# Patient Record
Sex: Female | Born: 1955 | Race: White | Hispanic: No | Marital: Married | Smoking: Never smoker
Health system: Southern US, Community
[De-identification: ages and names within clinical notes are randomized; demographics above are authoritative.]

## PROBLEM LIST (undated history)

## (undated) DIAGNOSIS — Z87442 Personal history of urinary calculi: Secondary | ICD-10-CM

## (undated) HISTORY — PX: ABDOMINAL HYSTERECTOMY: SHX81

## (undated) HISTORY — PX: LAPAROSCOPIC BILATERAL SALPINGO OOPHERECTOMY: SHX5890

---

## 2005-01-13 ENCOUNTER — Ambulatory Visit (HOSPITAL_COMMUNITY): Admission: RE | Admit: 2005-01-13 | Discharge: 2005-01-13 | Payer: Self-pay | Admitting: Family Medicine

## 2007-04-05 ENCOUNTER — Ambulatory Visit (HOSPITAL_COMMUNITY): Admission: RE | Admit: 2007-04-05 | Discharge: 2007-04-05 | Payer: Self-pay | Admitting: Family Medicine

## 2007-11-03 ENCOUNTER — Ambulatory Visit (HOSPITAL_COMMUNITY): Admission: RE | Admit: 2007-11-03 | Discharge: 2007-11-03 | Payer: Self-pay | Admitting: Obstetrics & Gynecology

## 2007-11-03 ENCOUNTER — Encounter: Payer: Self-pay | Admitting: Obstetrics & Gynecology

## 2008-12-01 ENCOUNTER — Ambulatory Visit (HOSPITAL_COMMUNITY): Admission: RE | Admit: 2008-12-01 | Discharge: 2008-12-01 | Payer: Self-pay | Admitting: Family Medicine

## 2010-11-12 NOTE — Op Note (Signed)
Tracey Sparks, Tracey Sparks                 ACCOUNT NO.:  [id number]   MEDICAL RECORD NO.:  [id number]          PATIENT TYPE:  AMB   LOCATION:  DAY                           FACILITY:  [facility]   PHYSICIAN:  Lazaro Arms, M.D.   DATE OF BIRTH:  [birth year]   DATE OF PROCEDURE:  11/03/2007  DATE OF DISCHARGE:                               OPERATIVE REPORT   PREOPERATIVE DIAGNOSES:  1. Left lower quadrant pain.  2. 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   POSTOPERATIVE DIAGNOSES:  1. Left lower quadrant pain.  2. 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   PROCEDURE:  Laparoscopic bilateral salpingo-oophorectomy with lysis of  adhesions.   SURGEON:  Lazaro Arms, MD.   ANESTHESIA:  General endotracheal.   FINDINGS:  The patient had adhesions of the small bowel and large bowel  to the pelvic side wall and on to the bladder and vaginal cuff, which  were stuck down sort of bilaterally in tangle of adhesions into the  pelvic side wall.  They were liberated from the adhesions and then taken  down without difficulty. They, otherwise, appeared to be normal.   DESCRIPTION OF OPERATION:  The patient was taken to the operating room,  placed in the supine position, where she underwent general endotracheal  anesthesia.  She was placed in lithotomy position, prepped and draped in  the usual sterile fashion.  Incision was made in the umbilicus.  A  Veress needle was used and placed to peritoneal cavity with one pass  without difficulty.  It was confirmed and it was insufflated with CO2  gas.  A nonbladed direct visualization trocar was used and placed to the  peritoneal cavity with one pass without difficulty.  The incisions were  made in the suprapubic midline and also in left lower quadrant and 5-mm  trocars were placed without difficulty.  Adhesions were taken down with  a harmonic scalpel and also bluntly.  Care was made not to injure the  bowel.  Then, identified the ureters bilaterally.   Then, identified the  ovaries, liberated them from the tangle of adhesions, took down the  infundibulopelvic ligament bilaterally, took down the round ligaments  bilaterally and liberated from the pelvic side wall taking it down from  the peritoneum as well using a harmonic scalpel for both sides.  There  was good hemostasis.  Both ovaries removed with Endopouch.  Pelvis  irrigated vigorously, all pedicles found to be hemostatic.  Fluid was  left in the peritoneal cavity to try to prevent postoperative adhesions  from  occurring again.  The umbilical fascia was closed with single 0-Vicryl  suture and all skin was closed using skin staples.  The patient  tolerated the procedure well.  She experienced minimal blood loss.  Taken to recovery room in good stable condition.  All counts were  correct x3.      Lazaro Arms, M.D.  Electronically Signed     LHE/MEDQ  D:  11/03/2007  T:  11/03/2007  Job:  [id number]

## 2013-08-03 ENCOUNTER — Other Ambulatory Visit (HOSPITAL_COMMUNITY): Payer: Self-pay | Admitting: Family Medicine

## 2013-08-03 DIAGNOSIS — Z139 Encounter for screening, unspecified: Secondary | ICD-10-CM

## 2013-08-05 ENCOUNTER — Ambulatory Visit (HOSPITAL_COMMUNITY)
Admission: RE | Admit: 2013-08-05 | Discharge: 2013-08-05 | Disposition: A | Discharge status: Home / Self Care | Payer: BC Managed Care – PPO | Source: Ambulatory Visit | Attending: Family Medicine | Admitting: Family Medicine

## 2013-08-05 DIAGNOSIS — Z139 Encounter for screening, unspecified: Secondary | ICD-10-CM

## 2013-08-05 DIAGNOSIS — Z1231 Encounter for screening mammogram for malignant neoplasm of breast: Secondary | ICD-10-CM | POA: Insufficient documentation

## 2014-08-30 ENCOUNTER — Other Ambulatory Visit (HOSPITAL_COMMUNITY): Payer: Self-pay | Admitting: Family Medicine

## 2014-08-30 DIAGNOSIS — Z1231 Encounter for screening mammogram for malignant neoplasm of breast: Secondary | ICD-10-CM

## 2014-09-05 ENCOUNTER — Ambulatory Visit (HOSPITAL_COMMUNITY): Payer: BC Managed Care – PPO

## 2015-04-09 ENCOUNTER — Other Ambulatory Visit (HOSPITAL_COMMUNITY): Payer: Self-pay | Admitting: Family Medicine

## 2015-04-09 DIAGNOSIS — Z1231 Encounter for screening mammogram for malignant neoplasm of breast: Secondary | ICD-10-CM

## 2015-04-18 ENCOUNTER — Ambulatory Visit (HOSPITAL_COMMUNITY)
Admission: RE | Admit: 2015-04-18 | Discharge: 2015-04-18 | Disposition: A | Discharge status: Home / Self Care | Payer: BC Managed Care – PPO | Source: Ambulatory Visit | Attending: Family Medicine | Admitting: Family Medicine

## 2015-04-18 DIAGNOSIS — Z1231 Encounter for screening mammogram for malignant neoplasm of breast: Secondary | ICD-10-CM | POA: Insufficient documentation

## 2016-06-16 ENCOUNTER — Encounter (HOSPITAL_COMMUNITY): Payer: Self-pay | Admitting: *Deleted

## 2016-06-16 ENCOUNTER — Emergency Department (HOSPITAL_COMMUNITY)
Admission: EM | Admit: 2016-06-16 | Discharge: 2016-06-17 | Disposition: A | Discharge status: Home / Self Care | Payer: BC Managed Care – PPO | Source: Home / Self Care | Attending: Emergency Medicine | Admitting: Emergency Medicine

## 2016-06-16 DIAGNOSIS — F419 Anxiety disorder, unspecified: Secondary | ICD-10-CM | POA: Diagnosis not present

## 2016-06-16 DIAGNOSIS — Z87442 Personal history of urinary calculi: Secondary | ICD-10-CM | POA: Insufficient documentation

## 2016-06-16 DIAGNOSIS — N135 Crossing vessel and stricture of ureter without hydronephrosis: Secondary | ICD-10-CM

## 2016-06-16 DIAGNOSIS — N131 Hydronephrosis with ureteral stricture, not elsewhere classified: Secondary | ICD-10-CM | POA: Diagnosis present

## 2016-06-16 DIAGNOSIS — Z90710 Acquired absence of both cervix and uterus: Secondary | ICD-10-CM | POA: Diagnosis not present

## 2016-06-16 LAB — URINALYSIS, ROUTINE W REFLEX MICROSCOPIC
Bilirubin Urine: NEGATIVE
GLUCOSE, UA: NEGATIVE mg/dL
HGB URINE DIPSTICK: NEGATIVE
KETONES UR: 5 mg/dL — AB
LEUKOCYTES UA: NEGATIVE
NITRITE: NEGATIVE
PROTEIN: NEGATIVE mg/dL
Specific Gravity, Urine: 1.013 (ref 1.005–1.030)
pH: 7 (ref 5.0–8.0)

## 2016-06-16 NOTE — ED Provider Notes (Signed)
[facility]-EMERGENCY DEPT Provider Note   CSN: [id number] Arrival date & time: 06/16/16  2301 By signing my name below, I, Tracey Sparks, attest that this documentation has been prepared under the direction and in the presence of Tracey AlbeIva Becca Bayne, MD. Electronically Signed: Bridgette HabermannMaria Sparks, ED Scribe. 06/17/16. 12:13 AM.  Time seen 12:06 AM  History   Chief Complaint Chief Complaint  Patient presents with  . Flank Pain   HPI Comments: Tracey Sparks is a 60 y.o. female with no pertinent PMHx, who presents to the Emergency Department complaining of 10/10, shooting, right-sided flank pain and right-sided abdominal pain onset on December 18  with associated nausea and vomiting (x6-8 episodes). Pt states her pain initially was intermittent, then around 6:30 pm when her vomiting began, her pain became more constant. Pain is exacerbated with movement and lying flat. Nothing makes it feel better except having her husband rub the area. Pt notes she's been to physical therapy recently for a broken left humerus but denies any recent injury or trauma to the area. Pt is not a smoker and does not drink alcohol. Pt denies dysuria, hematuria, urinary frequency, fever, diarrhea, or any other associated symptoms. Patient states she has difficulty sitting still. She states her father had kidney stones.   PCP: Tracey Sparks, P  The history is provided by the patient. No language interpreter was used.    History reviewed. No pertinent past medical history.  There are no active problems to display for this patient.   Past Surgical History:  Procedure Laterality Date  . ABDOMINAL HYSTERECTOMY    . CESAREAN SECTION      OB History    No data available       Home Medications    Prior to Admission medications   Medication Sig Start Date End Date Taking? Authorizing Provider  ondansetron (ZOFRAN) 4 MG tablet Take 1 tablet (4 mg total) by mouth every 8 (eight) hours as needed for nausea or vomiting. 06/17/16   Tracey AlbeIva Ziv Welchel, MD    PERCOCET 5-325 MG tablet Take 1-2 tablets by mouth every 6 (six) hours as needed for severe pain. 06/17/16   Tracey AlbeIva Keneshia Tena, MD  PERCOCET 5-325 MG tablet Take 1-2 tablets by mouth every 6 (six) hours as needed for severe pain. 06/17/16   Tracey AlbeIva Rosamary Boudreau, MD  tamsulosin (FLOMAX) 0.4 MG CAPS capsule Take 1 po QD until you pass the stone. 06/17/16   Tracey AlbeIva Noora Locascio, MD    Family History History reviewed. No pertinent family history.  Social History Social History  Substance Use Topics  . Smoking status: Never Smoker  . Smokeless tobacco: Never Used  . Alcohol use No  employed Lives with spouse   Allergies   Patient has no known allergies.   Review of Systems Review of Systems  Constitutional: Negative for chills and fever.  Gastrointestinal: Positive for abdominal pain, nausea and vomiting. Negative for diarrhea.  Genitourinary: Positive for flank pain. Negative for dysuria, frequency and hematuria.  All other systems reviewed and are negative.    Physical Exam Updated Vital Signs BP 153/86 (BP Location: Right Arm)   Pulse 83   Temp 97.6 F (36.4 C) (Oral)   Resp 20   Ht 5\' 2"  (1.575 m)   Wt 150 lb (68 kg)   SpO2 100%   BMI 27.44 kg/m   Vital signs normal    Physical Exam  Constitutional: She is oriented to person, place, and time. She appears well-developed and well-nourished.  Non-toxic appearance. She does not  appear ill. She appears distressed.  Pt seems to have difficulty sitting still.  HENT:  Head: Normocephalic and atraumatic.  Right Ear: External ear normal.  Left Ear: External ear normal.  Nose: Nose normal. No mucosal edema or rhinorrhea.  Mouth/Throat: Oropharynx is clear and moist. No dental abscesses or uvula swelling.  Eyes: Conjunctivae and EOM are normal. Pupils are equal, round, and reactive to light.  Neck: Normal range of motion and full passive range of motion without pain. Neck supple.  Cardiovascular: Normal rate, regular rhythm and normal heart sounds.   Exam reveals no gallop and no friction rub.   No murmur heard. Pulmonary/Chest: Effort normal and breath sounds normal. No respiratory distress. She has no wheezes. She has no rales. She exhibits no tenderness.  Abdominal: Soft. Bowel sounds are normal. She exhibits no distension and no mass. There is tenderness. There is no guarding.  Diffuse right-sided tenderness.  Genitourinary:  Genitourinary Comments: Tenderness to palpation to right flank.  Musculoskeletal: Normal range of motion. She exhibits no edema or tenderness.  Moves all extremities well.  Neurological: She is alert and oriented to person, place, and time. No cranial nerve deficit.  Skin: Skin is warm and dry. No rash noted. No erythema. No pallor.  Psychiatric: She has a normal mood and affect. Her behavior is normal. Her mood appears not anxious.  Nursing note and vitals reviewed.    ED Treatments / Results  DIAGNOSTIC STUDIES: Oxygen Saturation is 100% on RA, normal by my interpretation.      Labs (all labs ordered are listed, but only abnormal results are displayed) Results for orders placed or performed during the hospital encounter of 06/16/16  Urinalysis, Routine w reflex microscopic  Result Value Ref Range   Color, Urine STRAW (A) YELLOW   APPearance CLEAR CLEAR   Specific Gravity, Urine 1.013 1.005 - 1.030   pH 7.0 5.0 - 8.0   Glucose, UA NEGATIVE NEGATIVE mg/dL   Hgb urine dipstick NEGATIVE NEGATIVE   Bilirubin Urine NEGATIVE NEGATIVE   Ketones, ur 5 (A) NEGATIVE mg/dL   Protein, ur NEGATIVE NEGATIVE mg/dL   Nitrite NEGATIVE NEGATIVE   Leukocytes, UA NEGATIVE NEGATIVE   RBC / HPF 0-5 0 - 5 RBC/hpf   WBC, UA 0-5 0 - 5 WBC/hpf   Bacteria, UA RARE (A) NONE SEEN  CBC with Differential  Result Value Ref Range   WBC 7.0 4.0 - 10.5 K/uL   RBC 4.19 3.87 - 5.11 MIL/uL   Hemoglobin 12.8 12.0 - 15.0 g/dL   HCT 78.2 95.6 - 21.3 %   MCV 95.0 78.0 - 100.0 fL   MCH 30.5 26.0 - 34.0 pg   MCHC 32.2 30.0 - 36.0  g/dL   RDW 08.6 57.8 - 46.9 %   Platelets 182 150 - 400 K/uL   Neutrophils Relative % 90 %   Neutro Abs 6.3 1.7 - 7.7 K/uL   Lymphocytes Relative 6 %   Lymphs Abs 0.4 (L) 0.7 - 4.0 K/uL   Monocytes Relative 4 %   Monocytes Absolute 0.3 0.1 - 1.0 K/uL   Eosinophils Relative 0 %   Eosinophils Absolute 0.0 0.0 - 0.7 K/uL   Basophils Relative 0 %   Basophils Absolute 0.0 0.0 - 0.1 K/uL  Comprehensive metabolic panel  Result Value Ref Range   Sodium 137 135 - 145 mmol/L   Potassium 3.4 (L) 3.5 - 5.1 mmol/L   Chloride 102 101 - 111 mmol/L   CO2 27 22 - 32  mmol/L   Glucose, Bld 129 (H) 65 - 99 mg/dL   BUN 16 6 - 20 mg/dL   Creatinine, Ser 1.610.71 0.44 - 1.00 mg/dL   Calcium 9.1 8.9 - 09.610.3 mg/dL   Total Protein 6.9 6.5 - 8.1 g/dL   Albumin 3.9 3.5 - 5.0 g/dL   AST 50 (H) 15 - 41 U/L   ALT 16 14 - 54 U/L   Alkaline Phosphatase 92 38 - 126 U/L   Total Bilirubin 0.6 0.3 - 1.2 mg/dL   GFR calc non Af Amer >60 >60 mL/min   GFR calc Af Amer >60 >60 mL/min   Anion gap 8 5 - 15   Laboratory interpretation all normal except mild hypokalemia   EKG  EKG Interpretation None       Radiology Ct Renal Stone Study  Result Date: 06/17/2016 CLINICAL DATA:  Right-sided flank pain, onset yesterday. The pain initially was intermittent but became more constant around 18:30. EXAM: CT ABDOMEN AND PELVIS WITHOUT CONTRAST TECHNIQUE: Multidetector CT imaging of the abdomen and pelvis was performed following the standard protocol without IV contrast. COMPARISON:  Ultrasound 04/01/2016 FINDINGS: Lower chest: No acute abnormality. Hepatobiliary: No focal liver abnormality is seen. No gallstones, gallbladder wall thickening, or biliary dilatation. Pancreas: Unremarkable. No pancreatic ductal dilatation or surrounding inflammatory changes. Spleen: Normal in size without focal abnormality. Adrenals/Urinary Tract: Both adrenals are normal. There is marked hydronephrosis on the right, with abrupt caliber transition  at the ureteropelvic junction. There is perinephric and proximal periureteral stranding. This has the appearance of an acute proximal ureteral obstruction, but there is no evidence of an obstructing calculus. This could represent obstruction by clot or by soft tissue mass. Left kidney and ureter are normal. Urinary bladder is unremarkable. Stomach/Bowel: Stomach and small bowel are normal. Probable appendectomy. Colon is remarkable only for uncomplicated mild diverticulosis. Vascular/Lymphatic: The abdominal aorta is normal in caliber without atherosclerotic calcification. No adenopathy is evident in the abdomen or pelvis. Reproductive: Status post hysterectomy. No adnexal masses. Other: No ascites. Musculoskeletal: No significant skeletal lesion. IMPRESSION: Hydronephrosis and perinephric stranding on the right, likely an acute UPJ/proximal ureteral obstruction without visible calculus. This could represent obstruction by clot or by soft tissue mass. Incidental findings include mild diverticulosis, prior hysterectomy and probable appendectomy. Electronically Signed   By: Ellery Plunkaniel R Mitchell M.D.   On: 06/17/2016 01:04    Procedures Procedures (including critical care time)  Medications Ordered in ED Medications  0.9 %  sodium chloride infusion ( Intravenous New Bag/Given 06/17/16 0026)  HYDROmorphone (DILAUDID) 1 MG/ML injection (not administered)  fentaNYL (SUBLIMAZE) injection 50 mcg (50 mcg Intravenous Given 06/17/16 0025)  ondansetron (ZOFRAN) injection 4 mg (4 mg Intravenous Given 06/17/16 0025)  HYDROmorphone (DILAUDID) injection 1 mg (1 mg Intravenous Given 06/17/16 0153)     Initial Impression / Assessment and Plan / ED Course  I have reviewed the triage vital signs and the nursing notes.  Pertinent labs & imaging results that were available during my care of the patient were reviewed by me and considered in my medical decision making (see chart for details).  Clinical Course     COORDINATION OF CARE: 12:11 AM Discussed treatment plan with pt at bedside which includes urinalysis, abdomen CT, and IV fluids and pt agreed to plan. Patient's symptoms and history are suggestive of ureteral colic.  After reviewing patient's CT scan I discussed her with Dr. Hubert AzureGray P, urologist at 1:38 AM. He states that there is no ferning testing that I  need to do in the ED tonight. He states they can follow her up in the office once we begin her pain controlled.  Patient was rechecked at 1:50 AM and we discussed her test results. She states her pain was gone but is starting to return. She was given Dilaudid for pain. We discussed f/u with urology and reasons to return to the ED.    Final Clinical Impressions(s) / ED Diagnoses   Final diagnoses:  Ureteral obstruction, right    New Prescriptions New Prescriptions   ONDANSETRON (ZOFRAN) 4 MG TABLET    Take 1 tablet (4 mg total) by mouth every 8 (eight) hours as needed for nausea or vomiting.   PERCOCET 5-325 MG TABLET    Take 1-2 tablets by mouth every 6 (six) hours as needed for severe pain.   PERCOCET 5-325 MG TABLET    Take 1-2 tablets by mouth every 6 (six) hours as needed for severe pain.   TAMSULOSIN (FLOMAX) 0.4 MG CAPS CAPSULE    Take 1 po QD until you pass the stone.   Plan discharge  Tracey Albe, MD, FACEP   I personally performed the services described in this documentation, which was scribed in my presence. The recorded information has been reviewed and considered.  Tracey Albe, MD, Concha Pyo, MD 06/17/16 [phone]

## 2016-06-16 NOTE — ED Triage Notes (Signed)
Pt c/o right flank pain that started earlier this evening with n/v

## 2016-06-17 ENCOUNTER — Ambulatory Visit: Payer: Self-pay | Admitting: Urology

## 2016-06-17 ENCOUNTER — Encounter (HOSPITAL_COMMUNITY)
Admission: AD | Disposition: A | Discharge status: Home / Self Care | Payer: Self-pay | Source: Ambulatory Visit | Attending: Urology

## 2016-06-17 ENCOUNTER — Encounter (INDEPENDENT_AMBULATORY_CARE_PROVIDER_SITE_OTHER): Payer: BC Managed Care – PPO | Admitting: Urology

## 2016-06-17 ENCOUNTER — Emergency Department (HOSPITAL_COMMUNITY): Payer: BC Managed Care – PPO

## 2016-06-17 ENCOUNTER — Ambulatory Visit (HOSPITAL_COMMUNITY)
Admission: AD | Admit: 2016-06-17 | Discharge: 2016-06-17 | Disposition: A | Discharge status: Home / Self Care | Payer: BC Managed Care – PPO | Source: Ambulatory Visit | Attending: Urology | Admitting: Urology

## 2016-06-17 ENCOUNTER — Ambulatory Visit (HOSPITAL_COMMUNITY): Payer: BC Managed Care – PPO | Admitting: Anesthesiology

## 2016-06-17 ENCOUNTER — Encounter (HOSPITAL_COMMUNITY): Payer: Self-pay

## 2016-06-17 ENCOUNTER — Other Ambulatory Visit: Payer: Self-pay | Admitting: Radiology

## 2016-06-17 ENCOUNTER — Ambulatory Visit (HOSPITAL_COMMUNITY): Payer: BC Managed Care – PPO

## 2016-06-17 DIAGNOSIS — Z90710 Acquired absence of both cervix and uterus: Secondary | ICD-10-CM | POA: Insufficient documentation

## 2016-06-17 DIAGNOSIS — N1330 Unspecified hydronephrosis: Secondary | ICD-10-CM | POA: Diagnosis not present

## 2016-06-17 DIAGNOSIS — N131 Hydronephrosis with ureteral stricture, not elsewhere classified: Secondary | ICD-10-CM

## 2016-06-17 DIAGNOSIS — F419 Anxiety disorder, unspecified: Secondary | ICD-10-CM | POA: Insufficient documentation

## 2016-06-17 HISTORY — PX: CYSTOSCOPY WITH STENT PLACEMENT: SHX5790

## 2016-06-17 HISTORY — DX: Personal history of urinary calculi: Z87.442

## 2016-06-17 HISTORY — PX: CYSTOSCOPY W/ RETROGRADES: SHX1426

## 2016-06-17 LAB — CBC WITH DIFFERENTIAL/PLATELET
Basophils Absolute: 0 10*3/uL (ref 0.0–0.1)
Basophils Relative: 0 %
EOS PCT: 0 %
Eosinophils Absolute: 0 10*3/uL (ref 0.0–0.7)
HEMATOCRIT: 39.8 % (ref 36.0–46.0)
Hemoglobin: 12.8 g/dL (ref 12.0–15.0)
LYMPHS ABS: 0.4 10*3/uL — AB (ref 0.7–4.0)
LYMPHS PCT: 6 %
MCH: 30.5 pg (ref 26.0–34.0)
MCHC: 32.2 g/dL (ref 30.0–36.0)
MCV: 95 fL (ref 78.0–100.0)
MONO ABS: 0.3 10*3/uL (ref 0.1–1.0)
Monocytes Relative: 4 %
NEUTROS ABS: 6.3 10*3/uL (ref 1.7–7.7)
Neutrophils Relative %: 90 %
PLATELETS: 182 10*3/uL (ref 150–400)
RBC: 4.19 MIL/uL (ref 3.87–5.11)
RDW: 13 % (ref 11.5–15.5)
WBC: 7 10*3/uL (ref 4.0–10.5)

## 2016-06-17 LAB — COMPREHENSIVE METABOLIC PANEL
ALT: 16 U/L (ref 14–54)
AST: 50 U/L — ABNORMAL HIGH (ref 15–41)
Albumin: 3.9 g/dL (ref 3.5–5.0)
Alkaline Phosphatase: 92 U/L (ref 38–126)
Anion gap: 8 (ref 5–15)
BILIRUBIN TOTAL: 0.6 mg/dL (ref 0.3–1.2)
BUN: 16 mg/dL (ref 6–20)
CHLORIDE: 102 mmol/L (ref 101–111)
CO2: 27 mmol/L (ref 22–32)
CREATININE: 0.71 mg/dL (ref 0.44–1.00)
Calcium: 9.1 mg/dL (ref 8.9–10.3)
Glucose, Bld: 129 mg/dL — ABNORMAL HIGH (ref 65–99)
Potassium: 3.4 mmol/L — ABNORMAL LOW (ref 3.5–5.1)
Sodium: 137 mmol/L (ref 135–145)
TOTAL PROTEIN: 6.9 g/dL (ref 6.5–8.1)

## 2016-06-17 SURGERY — CYSTOSCOPY, WITH RETROGRADE PYELOGRAM
Anesthesia: General | Site: Ureter | Laterality: Right

## 2016-06-17 MED ORDER — TAMSULOSIN HCL 0.4 MG PO CAPS: ORAL_CAPSULE | ORAL | 0 refills | Status: DC

## 2016-06-17 MED ORDER — OXYCODONE HCL 5 MG PO TABS: 5.0000 mg | ORAL_TABLET | ORAL | Status: DC | PRN

## 2016-06-17 MED ORDER — LIDOCAINE HCL (PF) 1 % IJ SOLN
INTRAMUSCULAR | Status: AC
  Filled 2016-06-17: qty 5

## 2016-06-17 MED ORDER — ONDANSETRON HCL 4 MG/2ML IJ SOLN
INTRAMUSCULAR | Status: DC | PRN
  Administered 2016-06-17: 4 mg via INTRAVENOUS

## 2016-06-17 MED ORDER — SULFAMETHOXAZOLE-TRIMETHOPRIM 800-160 MG PO TABS: 1.0000 | ORAL_TABLET | Freq: Two times a day (BID) | ORAL | 0 refills | Status: DC

## 2016-06-17 MED ORDER — PERCOCET 5-325 MG PO TABS: 1.0000 | ORAL_TABLET | Freq: Four times a day (QID) | ORAL | 0 refills | Status: DC | PRN

## 2016-06-17 MED ORDER — CEFAZOLIN SODIUM-DEXTROSE 2-4 GM/100ML-% IV SOLN
INTRAVENOUS | Status: AC
  Filled 2016-06-17: qty 100

## 2016-06-17 MED ORDER — LACTATED RINGERS IV SOLN
INTRAVENOUS | Status: DC
  Administered 2016-06-17: 1000 mL via INTRAVENOUS

## 2016-06-17 MED ORDER — FENTANYL CITRATE (PF) 100 MCG/2ML IJ SOLN
INTRAMUSCULAR | Status: AC
  Filled 2016-06-17: qty 2

## 2016-06-17 MED ORDER — PROPOFOL 10 MG/ML IV BOLUS
INTRAVENOUS | Status: AC
  Filled 2016-06-17: qty 20

## 2016-06-17 MED ORDER — SODIUM CHLORIDE 0.9 % IV SOLN: 250.0000 mL | INTRAVENOUS | Status: DC | PRN

## 2016-06-17 MED ORDER — SODIUM CHLORIDE 0.9 % IR SOLN
Status: DC | PRN
  Administered 2016-06-17: 3000 mL

## 2016-06-17 MED ORDER — METOCLOPRAMIDE HCL 5 MG/ML IJ SOLN
INTRAMUSCULAR | Status: AC
  Filled 2016-06-17: qty 2

## 2016-06-17 MED ORDER — SODIUM CHLORIDE 0.9% FLUSH: 3.0000 mL | Freq: Two times a day (BID) | INTRAVENOUS | Status: DC

## 2016-06-17 MED ORDER — SODIUM CHLORIDE 0.9 % IV SOLN
INTRAVENOUS | Status: DC
  Administered 2016-06-17: via INTRAVENOUS

## 2016-06-17 MED ORDER — SODIUM CHLORIDE 0.9% FLUSH: 3.0000 mL | INTRAVENOUS | Status: DC | PRN

## 2016-06-17 MED ORDER — ONDANSETRON HCL 4 MG/2ML IJ SOLN
4.0000 mg | Freq: Once | INTRAMUSCULAR | Status: AC
  Administered 2016-06-17: 4 mg via INTRAVENOUS
  Filled 2016-06-17: qty 2

## 2016-06-17 MED ORDER — ONDANSETRON HCL 4 MG/2ML IJ SOLN
INTRAMUSCULAR | Status: AC
  Filled 2016-06-17: qty 2

## 2016-06-17 MED ORDER — FENTANYL CITRATE (PF) 100 MCG/2ML IJ SOLN
INTRAMUSCULAR | Status: DC | PRN
  Administered 2016-06-17: 50 ug via INTRAVENOUS

## 2016-06-17 MED ORDER — FENTANYL CITRATE (PF) 100 MCG/2ML IJ SOLN
50.0000 ug | Freq: Once | INTRAMUSCULAR | Status: AC
  Administered 2016-06-17: 50 ug via INTRAVENOUS
  Filled 2016-06-17: qty 2

## 2016-06-17 MED ORDER — DEXAMETHASONE SODIUM PHOSPHATE 10 MG/ML IJ SOLN
INTRAMUSCULAR | Status: DC | PRN
  Administered 2016-06-17: 4 mg via INTRAVENOUS

## 2016-06-17 MED ORDER — MIDAZOLAM HCL 2 MG/2ML IJ SOLN
INTRAMUSCULAR | Status: AC
  Filled 2016-06-17: qty 4

## 2016-06-17 MED ORDER — ACETAMINOPHEN 325 MG PO TABS: 650.0000 mg | ORAL_TABLET | ORAL | Status: DC | PRN

## 2016-06-17 MED ORDER — CEFAZOLIN SODIUM-DEXTROSE 2-3 GM-% IV SOLR
INTRAVENOUS | Status: DC | PRN
  Administered 2016-06-17: 2 g via INTRAVENOUS

## 2016-06-17 MED ORDER — METOCLOPRAMIDE HCL 5 MG/ML IJ SOLN
INTRAMUSCULAR | Status: DC | PRN
  Administered 2016-06-17: 10 mg via INTRAVENOUS

## 2016-06-17 MED ORDER — DEXAMETHASONE SODIUM PHOSPHATE 4 MG/ML IJ SOLN
INTRAMUSCULAR | Status: AC
  Filled 2016-06-17: qty 1

## 2016-06-17 MED ORDER — HYDROMORPHONE HCL 1 MG/ML IJ SOLN
INTRAMUSCULAR | Status: AC
  Filled 2016-06-17: qty 1

## 2016-06-17 MED ORDER — LIDOCAINE HCL 1 % IJ SOLN
INTRAMUSCULAR | Status: DC | PRN
  Administered 2016-06-17: 25 mg via INTRADERMAL

## 2016-06-17 MED ORDER — MIDAZOLAM HCL 5 MG/5ML IJ SOLN
INTRAMUSCULAR | Status: DC | PRN
  Administered 2016-06-17 (×2): 2 mg via INTRAVENOUS

## 2016-06-17 MED ORDER — DIATRIZOATE MEGLUMINE 30 % UR SOLN
URETHRAL | Status: DC | PRN
  Administered 2016-06-17: 300 mL via URETHRAL

## 2016-06-17 MED ORDER — ACETAMINOPHEN 650 MG RE SUPP
650.0000 mg | RECTAL | Status: DC | PRN
  Filled 2016-06-17: qty 1

## 2016-06-17 MED ORDER — HYDROMORPHONE HCL 1 MG/ML IJ SOLN
1.0000 mg | Freq: Once | INTRAMUSCULAR | Status: AC
  Administered 2016-06-17: 1 mg via INTRAVENOUS

## 2016-06-17 MED ORDER — DIATRIZOATE MEGLUMINE 30 % UR SOLN
URETHRAL | Status: AC
  Filled 2016-06-17: qty 300

## 2016-06-17 MED ORDER — OXYBUTYNIN CHLORIDE 5 MG PO TABS: 5.0000 mg | ORAL_TABLET | Freq: Three times a day (TID) | ORAL | 1 refills | Status: DC | PRN

## 2016-06-17 MED ORDER — ONDANSETRON HCL 4 MG PO TABS: 4.0000 mg | ORAL_TABLET | Freq: Three times a day (TID) | ORAL | 0 refills | Status: DC | PRN

## 2016-06-17 MED ORDER — PROPOFOL 10 MG/ML IV BOLUS
INTRAVENOUS | Status: DC | PRN
  Administered 2016-06-17: 120 mg via INTRAVENOUS

## 2016-06-17 SURGICAL SUPPLY — 27 items
BAG DRAIN URO TABLE W/ADPT NS (DRAPE) ×3 IMPLANT
BAG HAMPER (MISCELLANEOUS) ×3 IMPLANT
BAG URINE LEG 500ML (DRAIN) IMPLANT
CATH FOLEY 2WAY SLVR  5CC 18FR (CATHETERS)
CATH FOLEY 2WAY SLVR 5CC 18FR (CATHETERS) IMPLANT
CATH INTERMIT  6FR 70CM (CATHETERS) ×3 IMPLANT
CLOTH BEACON ORANGE TIMEOUT ST (SAFETY) ×3 IMPLANT
DECANTER SPIKE VIAL GLASS SM (MISCELLANEOUS) ×3 IMPLANT
GLOVE BIOGEL M 8.0 STRL (GLOVE) ×3 IMPLANT
GOWN STRL REUS W/ TWL LRG LVL3 (GOWN DISPOSABLE) ×1 IMPLANT
GOWN STRL REUS W/TWL LRG LVL3 (GOWN DISPOSABLE) ×5 IMPLANT
GOWN STRL REUS W/TWL XL LVL3 (GOWN DISPOSABLE) ×3 IMPLANT
GUIDEWIRE ANG ZIPWIRE 038X150 (WIRE) ×6 IMPLANT
GUIDEWIRE STR DUAL SENSOR (WIRE) ×3 IMPLANT
IV NS IRRIG 3000ML ARTHROMATIC (IV SOLUTION) ×3 IMPLANT
KIT ROOM TURNOVER AP CYSTO (KITS) ×3 IMPLANT
MANIFOLD NEPTUNE II (INSTRUMENTS) ×3 IMPLANT
NEEDLE HYPO 18GX1.5 BLUNT FILL (NEEDLE) IMPLANT
NS IRRIG 1000ML POUR BTL (IV SOLUTION) ×3 IMPLANT
NS IRRIG 500ML POUR BTL (IV SOLUTION) IMPLANT
PACK CYSTO (CUSTOM PROCEDURE TRAY) ×3 IMPLANT
PAD ARMBOARD 7.5X6 YLW CONV (MISCELLANEOUS) ×3 IMPLANT
PAD TELFA 3X4 1S STER (GAUZE/BANDAGES/DRESSINGS) ×3 IMPLANT
STENT URET 6FRX24 CONTOUR (STENTS) ×3 IMPLANT
TOWEL OR 17X26 4PK STRL BLUE (TOWEL DISPOSABLE) ×3 IMPLANT
WATER STERILE IRR 1000ML POUR (IV SOLUTION) IMPLANT
WATER STERILE IRR 3000ML UROMA (IV SOLUTION) IMPLANT

## 2016-06-17 NOTE — Discharge Instructions (Signed)
Drink plenty of fluids. Take the medication as prescribed. Call [facility] to get an appointment in the next 24 hrs. You have a blockage of your right UPJ. Return to the ED if you get a fever, or have uncontrolled vomiting or pain.

## 2016-06-17 NOTE — Anesthesia Preprocedure Evaluation (Addendum)
Anesthesia Evaluation  Patient identified by MRN, date of birth, ID band Patient awake    Reviewed: Allergy & Precautions, NPO status , Patient's Chart, lab work & pertinent test results, reviewed documented beta blocker date and time   History of Anesthesia Complications Negative for: history of anesthetic complications  Airway Mallampati: II  TM Distance: >3 FB Neck ROM: Full    Dental no notable dental hx. (+) Teeth Intact, Dental Advisory Given   Pulmonary neg pulmonary ROS,    Pulmonary exam normal breath sounds clear to auscultation       Cardiovascular Exercise Tolerance: Good Normal cardiovascular exam Rhythm:Regular Rate:Normal     Neuro/Psych Anxiety    GI/Hepatic neg GERD  ,  Endo/Other    Renal/GU      Musculoskeletal   Abdominal (+) + obese,  Abdomen: soft. Bowel sounds: normal.  Peds  Hematology   Anesthesia Other Findings Obese      Hx kidney stones  Fractured Left Humeral head 1 month ago  Reproductive/Obstetrics                            Anesthesia Physical Anesthesia Plan  ASA: II  Anesthesia Plan: General   Post-op Pain Management:    Induction: Intravenous  Airway Management Planned: LMA  Additional Equipment:   Intra-op Plan:   Post-operative Plan: Extubation in OR  Informed Consent:   Plan Discussed with: CRNA and Anesthesiologist  Anesthesia Plan Comments:         Anesthesia Quick Evaluation

## 2016-06-17 NOTE — Discharge Instructions (Signed)
Cystoscopy Cystoscopy is a procedure that is used to help diagnose and sometimes treat conditions that affect that lower urinary tract. The lower urinary tract includes the bladder and the tube that drains urine from the bladder out of the body (urethra). Cystoscopy is performed with a thin, tube-shaped instrument with a light and camera at the end (cystoscope). The cystoscope may be hard (rigid) or flexible, depending on the goal of the procedure.The cystoscope is inserted through the urethra, into the bladder. Cystoscopy may be recommended if you have: Urinary tractinfections that keep coming back (recurring). Blood in the urine (hematuria). Loss of bladder control (urinary incontinence) or an overactive bladder. Unusual cells found in a urine sample. A blockage in the urethra. Painful urination. An abnormality in the bladder found during an intravenous pyelogram (IVP) or CT scan. Cystoscopy may also be done to remove a sample of tissue to be examined under a microscope (biopsy). Tell a health care provider about: Any allergies you have. All medicines you are taking, including vitamins, herbs, eye drops, creams, and over-the-counter medicines. Any problems you or family members have had with anesthetic medicines. Any blood disorders you have. Any surgeries you have had. Any medical conditions you have. Whether you are pregnant or may be pregnant. What are the risks? Generally, this is a safe procedure. However, problems may occur, including: Infection. Bleeding. Allergic reactions to medicines. Damage to other structures or organs. What happens before the procedure? Ask your health care provider about: Changing or stopping your regular medicines. This is especially important if you are taking diabetes medicines or blood thinners. Taking medicines such as aspirin and ibuprofen. These medicines can thin your blood. Do not take these medicines before your procedure if your health  care provider instructs you not to. Follow instructions from your health care provider about eating or drinking restrictions. You may be given antibiotic medicine to help prevent infection. You may have an exam or testing, such as X-rays of the bladder, urethra, or kidneys. You may have urine tests to check for signs of infection. Plan to have someone take you home after the procedure. What happens during the procedure? To reduce your risk of infection,your health care team will wash or sanitize their hands. You will be given one or more of the following: A medicine to help you relax (sedative). A medicine to numb the area (local anesthetic). The area around the opening of your urethra will be cleaned. The cystoscope will be passed through your urethra into your bladder. Germ-free (sterile)fluid will flow through the cystoscope to fill your bladder. The fluid will stretch your bladder so that your surgeon can clearly examine your bladder walls. The cystoscope will be removed and your bladder will be emptied. The procedure may vary among health care providers and hospitals. What happens after the procedure? You may have some soreness or pain in your abdomen and urethra. Medicines will be available to help you. You may have some blood in your urine. Do not drive for 24 hours if you received a sedative. This information is not intended to replace advice given to you by your health care provider. Make sure you discuss any questions you have with your health care provider. Document Released: 06/13/2000 Document Revised: 10/25/2015 Document Reviewed: 05/03/2015 Elsevier Interactive Patient Education  2017 [employer] Cystoscopy, Care After Refer to this sheet in the next few weeks. These instructions provide you with information about caring for yourself after your procedure. Your health care provider may also  give you more specific instructions. Your treatment has been planned according to  current medical practices, but problems sometimes occur. Call your health care provider if you have any problems or questions after your procedure. What can I expect after the procedure? After the procedure, it is common to have: Mild pain when you urinate. Pain should stop within a few minutes after you urinate. This may last for up to 1 week. A small amount of blood in your urine for several days. Feeling like you need to urinate but producing only a small amount of urine. Follow these instructions at home:   Medicines Take over-the-counter and prescription medicines only as told by your health care provider. If you were prescribed an antibiotic medicine, take it as told by your health care provider. Do not stop taking the antibiotic even if you start to feel better. General instructions Return to your normal activities as told by your health care provider. Ask your health care provider what activities are safe for you. Do not drive for 24 hours if you received a sedative. Watch for any blood in your urine. If the amount of blood in your urine increases, call your health care provider. Follow instructions from your health care provider about eating or drinking restrictions. If a tissue sample was removed for testing (biopsy) during your procedure, it is your responsibility to get your test results. Ask your health care provider or the department performing the test when your results will be ready. Drink enough fluid to keep your urine clear or pale yellow. Keep all follow-up visits as told by your health care provider. This is important. Contact a health care provider if: You have pain that gets worse or does not get better with medicine, especially pain when you urinate. You have difficulty urinating. Get help right away if: You have more blood in your urine. You have blood clots in your urine. You have abdominal pain. You have a fever or chills. You are unable to urinate. This  information is not intended to replace advice given to you by your health care provider. Make sure you discuss any questions you have with your health care provider. Document Released: 01/03/2005 Document Revised: 11/22/2015 Document Reviewed: 05/03/2015 Elsevier Interactive Patient Education  2017 [employer] 1. You may see some blood in the urine and may have some burning with urination for 48-72 hours. You also may notice that you have to urinate more frequently or urgently after your procedure which is normal.  2. You should call should you develop an inability urinate, fever > 101, persistent nausea and vomiting that prevents you from eating or drinking to stay hydrated.  3. If you have a stent, you will likely urinate more frequently and urgently until the stent is removed and you may experience some discomfort/pain in the lower abdomen and flank especially when urinating. You may take pain medication prescribed to you if needed for pain. You may also intermittently have blood in the urine until the stent is removed. 4. If you have a catheter, you will be taught how to take care of the catheter by the nursing staff prior to discharge from the hospital.  You may periodically feel a strong urge to void with the catheter in place.  This is a bladder spasm and most often can occur when having a bowel movement or moving around. It is typically self-limited and usually will stop after a few minutes.  You may use some Vaseline or Neosporin around the tip  of the catheter to reduce friction at the tip of the penis. You may also see some blood in the urine.  A very small amount of blood can make the urine look quite red.  As long as the catheter is draining well, there usually is not a problem.  However, if the catheter is not draining well and is bloody, you should call the office [phone]) to notify us.

## 2016-06-17 NOTE — Anesthesia Postprocedure Evaluation (Signed)
Anesthesia Post Note  Patient: Gareth Eagleeresa S Marina  Procedure(s) Performed: Procedure(s) (LRB): CYSTOSCOPY WITH RETROGRADE PYELOGRAM (Right) CYSTOSCOPY WITH STENT PLACEMENT (Right)  Patient location during evaluation: PACU Anesthesia Type: General Level of consciousness: awake and alert, patient cooperative and oriented Pain management: pain level controlled Vital Signs Assessment: post-procedure vital signs reviewed and stable Respiratory status: spontaneous breathing, nonlabored ventilation and respiratory function stable Cardiovascular status: blood pressure returned to baseline and stable Postop Assessment: no signs of nausea or vomiting Anesthetic complications: no     Last Vitals:  Vitals:   06/17/16 1705 06/17/16 1758  BP: 125/69 130/78  Pulse:  71  Resp: 18 18  Temp:  36.7 C    Last Pain:  Vitals:   06/17/16 1617  TempSrc: (P) Oral                 Brenon Antosh J

## 2016-06-17 NOTE — Anesthesia Procedure Notes (Signed)
Procedure Name: LMA Insertion Date/Time: 06/17/2016 5:23 PM Performed by: Despina HiddenIDACAVAGE, Tiasha Helvie J Pre-anesthesia Checklist: Patient identified, Patient being monitored, Emergency Drugs available, Timeout performed and Suction available Patient Re-evaluated:Patient Re-evaluated prior to inductionOxygen Delivery Method: Circle System Utilized Preoxygenation: Pre-oxygenation with 100% oxygen Intubation Type: IV induction Ventilation: Mask ventilation without difficulty LMA: LMA inserted LMA Size: 4.0 Number of attempts: 1 Placement Confirmation: positive ETCO2 and breath sounds checked- equal and bilateral Tube secured with: Tape Dental Injury: Teeth and Oropharynx as per pre-operative assessment

## 2016-06-17 NOTE — H&P (Signed)
H&P  Chief Complaint: Right flank pain  History of Present Illness: Tracey Sparks is a 60 y.o. year old female who presented to our office this am with acute onset rt flank pain 24 hrs ago. ER evaluation--CT revealed rt hydronephrosis. No hydroureter, no stone. She was felt to have a possible UPJ obstruction and presents at this time for cysto, rt RGP, rt J2 stent. No past medical history on file.  Past Surgical History:  Procedure Laterality Date  . ABDOMINAL HYSTERECTOMY    . CESAREAN SECTION      Home Medications:  No prescriptions prior to admission.    Allergies: No Known Allergies  No family history on file.  Social History:  reports that she has never smoked. She has never used smokeless tobacco. She reports that she does not drink alcohol or use drugs.  ROS: A complete review of systems was performed.  All systems are negative except for pertinent findings as noted.  Physical Exam:  Vital signs in last 24 hours: Temp:  [97.6 F (36.4 C)] 97.6 F (36.4 C) (12/18 2315) Pulse Rate:  [83] 83 (12/18 2315) Resp:  [20] 20 (12/18 2315) BP: (153)/(86) 153/86 (12/18 2315) SpO2:  [100 %] 100 % (12/18 2315) Weight:  [68 kg (150 lb)] 68 kg (150 lb) (12/18 2313) General:  Alert and oriented, No acute distress HEENT: Normocephalic, atraumatic Neck: No JVD or lymphadenopathy Cardiovascular: Regular rate and rhythm Lungs: Clear bilaterally Abdomen: Soft, nontender, nondistended, no abdominal masses Back: No CVA tenderness Extremities: No edema Neurologic: Grossly intact  Laboratory Data:  Results for orders placed or performed during the hospital encounter of 06/16/16 (from the past 24 hour(s))  Urinalysis, Routine w reflex microscopic     Status: Abnormal   Collection Time: 06/16/16 11:20 PM  Result Value Ref Range   Color, Urine STRAW (A) YELLOW   APPearance CLEAR CLEAR   Specific Gravity, Urine 1.013 1.005 - 1.030   pH 7.0 5.0 - 8.0   Glucose, UA NEGATIVE NEGATIVE  mg/dL   Hgb urine dipstick NEGATIVE NEGATIVE   Bilirubin Urine NEGATIVE NEGATIVE   Ketones, ur 5 (A) NEGATIVE mg/dL   Protein, ur NEGATIVE NEGATIVE mg/dL   Nitrite NEGATIVE NEGATIVE   Leukocytes, UA NEGATIVE NEGATIVE   RBC / HPF 0-5 0 - 5 RBC/hpf   WBC, UA 0-5 0 - 5 WBC/hpf   Bacteria, UA RARE (A) NONE SEEN  CBC with Differential     Status: Abnormal   Collection Time: 06/17/16 12:53 AM  Result Value Ref Range   WBC 7.0 4.0 - 10.5 K/uL   RBC 4.19 3.87 - 5.11 MIL/uL   Hemoglobin 12.8 12.0 - 15.0 g/dL   HCT 16.139.8 09.636.0 - 04.546.0 %   MCV 95.0 78.0 - 100.0 fL   MCH 30.5 26.0 - 34.0 pg   MCHC 32.2 30.0 - 36.0 g/dL   RDW 40.913.0 81.111.5 - 91.415.5 %   Platelets 182 150 - 400 K/uL   Neutrophils Relative % 90 %   Neutro Abs 6.3 1.7 - 7.7 K/uL   Lymphocytes Relative 6 %   Lymphs Abs 0.4 (L) 0.7 - 4.0 K/uL   Monocytes Relative 4 %   Monocytes Absolute 0.3 0.1 - 1.0 K/uL   Eosinophils Relative 0 %   Eosinophils Absolute 0.0 0.0 - 0.7 K/uL   Basophils Relative 0 %   Basophils Absolute 0.0 0.0 - 0.1 K/uL  Comprehensive metabolic panel     Status: Abnormal   Collection Time: 06/17/16 12:53  AM  Result Value Ref Range   Sodium 137 135 - 145 mmol/L   Potassium 3.4 (L) 3.5 - 5.1 mmol/L   Chloride 102 101 - 111 mmol/L   CO2 27 22 - 32 mmol/L   Glucose, Bld 129 (H) 65 - 99 mg/dL   BUN 16 6 - 20 mg/dL   Creatinine, Ser 7.820.71 0.44 - 1.00 mg/dL   Calcium 9.1 8.9 - 95.610.3 mg/dL   Total Protein 6.9 6.5 - 8.1 g/dL   Albumin 3.9 3.5 - 5.0 g/dL   AST 50 (H) 15 - 41 U/L   ALT 16 14 - 54 U/L   Alkaline Phosphatase 92 38 - 126 U/L   Total Bilirubin 0.6 0.3 - 1.2 mg/dL   GFR calc non Af Amer >60 >60 mL/min   GFR calc Af Amer >60 >60 mL/min   Anion gap 8 5 - 15   No results found for this or any previous visit (from the past 240 hour(s)). Creatinine:  Recent Labs  06/17/16 0053  CREATININE 0.71    Radiologic Imaging: Ct Renal Stone Study  Result Date: 06/17/2016 CLINICAL DATA:  Right-sided flank pain,  onset yesterday. The pain initially was intermittent but became more constant around 18:30. EXAM: CT ABDOMEN AND PELVIS WITHOUT CONTRAST TECHNIQUE: Multidetector CT imaging of the abdomen and pelvis was performed following the standard protocol without IV contrast. COMPARISON:  Ultrasound 04/01/2016 FINDINGS: Lower chest: No acute abnormality. Hepatobiliary: No focal liver abnormality is seen. No gallstones, gallbladder wall thickening, or biliary dilatation. Pancreas: Unremarkable. No pancreatic ductal dilatation or surrounding inflammatory changes. Spleen: Normal in size without focal abnormality. Adrenals/Urinary Tract: Both adrenals are normal. There is marked hydronephrosis on the right, with abrupt caliber transition at the ureteropelvic junction. There is perinephric and proximal periureteral stranding. This has the appearance of an acute proximal ureteral obstruction, but there is no evidence of an obstructing calculus. This could represent obstruction by clot or by soft tissue mass. Left kidney and ureter are normal. Urinary bladder is unremarkable. Stomach/Bowel: Stomach and small bowel are normal. Probable appendectomy. Colon is remarkable only for uncomplicated mild diverticulosis. Vascular/Lymphatic: The abdominal aorta is normal in caliber without atherosclerotic calcification. No adenopathy is evident in the abdomen or pelvis. Reproductive: Status post hysterectomy. No adnexal masses. Other: No ascites. Musculoskeletal: No significant skeletal lesion. IMPRESSION: Hydronephrosis and perinephric stranding on the right, likely an acute UPJ/proximal ureteral obstruction without visible calculus. This could represent obstruction by clot or by soft tissue mass. Incidental findings include mild diverticulosis, prior hysterectomy and probable appendectomy. Electronically Signed   By: Ellery Plunkaniel R Mitchell M.D.   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  Plan:  Cystoscopy, right RGP, probable rt J2 stent  Chelsea AusDAHLSTEDT, Jinnie Onley M 06/17/2016, 1:38 PM  Bertram MillardStephen M. Ellorie Kindall MD

## 2016-06-17 NOTE — Progress Notes (Signed)
Hey--I had to see this pt

## 2016-06-17 NOTE — Transfer of Care (Signed)
Immediate Anesthesia Transfer of Care Note  Patient: Tracey Sparks  Procedure(s) Performed: Procedure(s): CYSTOSCOPY WITH RETROGRADE PYELOGRAM (Right) CYSTOSCOPY WITH STENT PLACEMENT (Right)  Patient Location: PACU  Anesthesia Type:General  Level of Consciousness: awake and patient cooperative  Airway & Oxygen Therapy: Patient Spontanous Breathing and Patient connected to face mask oxygen  Post-op Assessment: Report given to RN, Post -op Vital signs reviewed and stable and Patient moving all extremities  Post vital signs: Reviewed and stable  Last Vitals:  Vitals:   06/17/16 1700 06/17/16 1705  BP: 116/65 125/69  Pulse:    Resp: 13 18  Temp:      Last Pain:  Vitals:   06/17/16 1617  TempSrc: (P) Oral      Patients Stated Pain Goal: 6 (06/17/16 1616)  Complications: No apparent anesthesia complications

## 2016-06-17 NOTE — Op Note (Signed)
Preoperative diagnosis: Right hydronephrosis/possible UPJ obstruction  Postoperative diagnosis: Same  Principal procedure: Cystoscopy, right retrograde ureteropyelogram, fluoroscopic interpretation, placement of double-J stent-24 centimeter by 6 French contour, without tether  Surgeon: Adell Koval  Anesthesia: Gen. with LMA  Complications: None  Drains: 24 centimeter by 6 French contour double-J stent, without tether    Estimated blood loss: None  Indications: 60 year old female with a one-day history of significant right flank pain, nausea and vomiting.  She presented to the emergency room at this hospital yesterday with a CT scan without contrast revealing a significantly dilated right renal pelvis and calyceal system, normal appearing right renal parenchyma, no evidence of stone, and a normal-appearing ureter.  To me, this is a picture of the UPJ obstruction.  The patient does not have a long-standing history of recurrent flank pain, however.  After evaluation in the office, I have recommended that we proceed with cystoscopy, right retrograde pyelogram and stent placement.  Over the past 12 hours prior to her presentation, she did have significant severe pain.  She was given Toradol in the office.  I discussed with the patient.  The possibility of a congenital UPJ obstruction, and placement of the stent.  She understands these and desires to proceed.  Findings: Bladder appeared normal.  Retrograde ureteropyelogram.  Findings revealed a normal right ureter with normal caliber, no filling defects.  At the UPJ, there was mild tortuosity.  There was significant right pyelocaliectasis with a very large right extrarenal pelvis.  This was consistent with UPJ obstruction.  Description of procedure: The patient was properly identified in the holding area.  She had been marked on the right side.  She received Ancef 2 grams IV.  She was taken to the operating room where general anesthesia was  administered with the LMA.  She was placed in the dorsolithotomy position.  Genitalia and perineum were prepped and draped.  Proper timeout was performed.  A 21 French panendoscope was advanced into her bladder which was circumferentially inspected.  Ureteral orifices were normal in configuration and location.  There were no bladder tumors, trabeculations or foreign bodies.  The right ureteral orifice was cannulated with a 6 [place] open-ended catheter.  Retrograde ureteropyelogram was performed.  This revealed the normal ureter.  There was significant tortuosity of the proximal ureter with beaking at the UPJ.  It was difficult to advance the open-ended catheter proximally in the ureter.  I ended up having to use an angled tip is up wire to assist with negotiating the open-ended catheter up the proximal ureter, to complete the retrograde study.  After adequate retrograde study, I then advanced the zip wire through the UPJ and into the upper pole calyceal system.  The open-ended catheter was advanced over the zip wire into the renal pelvis where an significant hydronephrotic drip was noted with the tip wire removed.  I then replaced the zip wire with the 0.038 inch sensor-tip guidewire.  Once this was adequately positioned in the upper pole calyceal system, I removed the open-ended catheter.  I then placed a 24 centimeter by 6 French contour double-J stent, with the string removed.  After this was adequately deployed and position was verified with fluoroscopy and cystoscopy, the scope was removed following bladder emptying.  The patient was then awakened and taken to the PACU in stable condition.  Patient tolerated the procedure well.

## 2016-06-19 ENCOUNTER — Encounter (HOSPITAL_COMMUNITY): Payer: Self-pay | Admitting: Urology

## 2016-06-24 ENCOUNTER — Other Ambulatory Visit: Payer: Self-pay | Admitting: Urology

## 2016-06-24 DIAGNOSIS — N131 Hydronephrosis with ureteral stricture, not elsewhere classified: Secondary | ICD-10-CM

## 2016-06-25 ENCOUNTER — Encounter (HOSPITAL_COMMUNITY): Payer: Self-pay

## 2016-06-25 ENCOUNTER — Encounter (HOSPITAL_COMMUNITY)
Admission: RE | Admit: 2016-06-25 | Discharge: 2016-06-25 | Disposition: A | Discharge status: Home / Self Care | Payer: BC Managed Care – PPO | Source: Ambulatory Visit | Attending: Urology | Admitting: Urology

## 2016-06-25 DIAGNOSIS — N131 Hydronephrosis with ureteral stricture, not elsewhere classified: Secondary | ICD-10-CM | POA: Diagnosis not present

## 2016-06-25 MED ORDER — TECHNETIUM TC 99M MERTIATIDE
5.0000 | Freq: Once | INTRAVENOUS | Status: AC | PRN
  Administered 2016-06-25: 5 via INTRAVENOUS

## 2016-06-25 MED ORDER — FUROSEMIDE 10 MG/ML IJ SOLN
INTRAMUSCULAR | Status: AC
  Administered 2016-06-25: 40 mg via INTRAVENOUS
  Filled 2016-06-25: qty 4

## 2016-06-25 MED ORDER — SODIUM CHLORIDE 0.9% FLUSH
INTRAVENOUS | Status: AC
  Filled 2016-06-25: qty 80

## 2016-06-25 MED FILL — Oxycodone w/ Acetaminophen Tab 5-325 MG: ORAL | Qty: 6 | Status: AC

## 2016-07-01 ENCOUNTER — Ambulatory Visit (INDEPENDENT_AMBULATORY_CARE_PROVIDER_SITE_OTHER): Payer: BC Managed Care – PPO | Admitting: Urology

## 2016-07-01 DIAGNOSIS — N131 Hydronephrosis with ureteral stricture, not elsewhere classified: Secondary | ICD-10-CM | POA: Diagnosis not present

## 2016-08-08 ENCOUNTER — Other Ambulatory Visit: Payer: Self-pay | Admitting: Urology

## 2016-08-26 ENCOUNTER — Encounter (HOSPITAL_COMMUNITY)
Admission: RE | Admit: 2016-08-26 | Discharge: 2016-08-26 | Disposition: A | Discharge status: Home / Self Care | Payer: BC Managed Care – PPO | Source: Ambulatory Visit | Attending: Urology | Admitting: Urology

## 2016-08-26 ENCOUNTER — Encounter (INDEPENDENT_AMBULATORY_CARE_PROVIDER_SITE_OTHER): Payer: Self-pay

## 2016-08-26 ENCOUNTER — Encounter (HOSPITAL_COMMUNITY): Payer: Self-pay

## 2016-08-26 DIAGNOSIS — Z01818 Encounter for other preprocedural examination: Secondary | ICD-10-CM | POA: Diagnosis present

## 2016-08-26 DIAGNOSIS — Z01812 Encounter for preprocedural laboratory examination: Secondary | ICD-10-CM | POA: Insufficient documentation

## 2016-08-26 DIAGNOSIS — N130 Hydronephrosis with ureteropelvic junction obstruction: Secondary | ICD-10-CM | POA: Diagnosis not present

## 2016-08-26 LAB — BASIC METABOLIC PANEL
ANION GAP: 5 (ref 5–15)
BUN: 15 mg/dL (ref 6–20)
CALCIUM: 8.9 mg/dL (ref 8.9–10.3)
CO2: 30 mmol/L (ref 22–32)
Chloride: 106 mmol/L (ref 101–111)
Creatinine, Ser: 0.78 mg/dL (ref 0.44–1.00)
Glucose, Bld: 92 mg/dL (ref 65–99)
Potassium: 4.2 mmol/L (ref 3.5–5.1)
SODIUM: 141 mmol/L (ref 135–145)

## 2016-08-26 LAB — CBC
HCT: 37.7 % (ref 36.0–46.0)
Hemoglobin: 12.2 g/dL (ref 12.0–15.0)
MCH: 30.3 pg (ref 26.0–34.0)
MCHC: 32.4 g/dL (ref 30.0–36.0)
MCV: 93.5 fL (ref 78.0–100.0)
PLATELETS: 219 10*3/uL (ref 150–400)
RBC: 4.03 MIL/uL (ref 3.87–5.11)
RDW: 13.9 % (ref 11.5–15.5)
WBC: 4.6 10*3/uL (ref 4.0–10.5)

## 2016-08-26 NOTE — Patient Instructions (Signed)
RENNY GUNNARSON  08/26/2016   Your procedure is scheduled on: 09/08/2016    Report to [facility] Main  Entrance take [place]  elevators to 3rd floor to  Short Stay Center at    0930 AM.  Call this number if you have problems the morning of surgery [phone]   Remember: ONLY 1 PERSON MAY GO WITH YOU TO SHORT STAY TO GET  READY MORNING OF YOUR SURGERY.  Do not eat food or drink liquids :After Midnight.     Take these medicines the morning of surgery with A SIP OF WATER: ditropan if needed                                 You may not have any metal on your body including hair pins and              piercings  Do not wear jewelry, make-up, lotions, powders or perfumes, deodorant             Do not wear nail polish.  Do not shave  48 hours prior to surgery.     Do not bring valuables to the hospital. [facility] IS NOT             RESPONSIBLE   FOR VALUABLES.  Contacts, dentures or bridgework may not be worn into surgery.  Leave suitcase in the car. After surgery it may be brought to your room.                     Please read over the following fact sheets you were given: _____________________________________________________________________             [facility] - Preparing for Surgery Before surgery, you can play an important role.  Because skin is not sterile, your skin needs to be as free of germs as possible.  You can reduce the number of germs on your skin by washing with CHG (chlorahexidine gluconate) soap before surgery.  CHG is an antiseptic cleaner which kills germs and bonds with the skin to continue killing germs even after washing. Please DO NOT use if you have an allergy to CHG or antibacterial soaps.  If your skin becomes reddened/irritated stop using the CHG and inform your nurse when you arrive at Short Stay. Do not shave (including legs and underarms) for at least 48 hours prior to the first CHG shower.  You may shave your face/neck. Please  follow these instructions carefully:  1.  Shower with CHG Soap the night before surgery and the  morning of Surgery.  2.  If you choose to wash your hair, wash your hair first as usual with your  normal  shampoo.  3.  After you shampoo, rinse your hair and body thoroughly to remove the  shampoo.                           4.  Use CHG as you would any other liquid soap.  You can apply chg directly  to the skin and wash                       Gently with a scrungie or clean washcloth.  5.  Apply the CHG Soap to your body ONLY FROM THE  NECK DOWN.   Do not use on face/ open                           Wound or open sores. Avoid contact with eyes, ears mouth and genitals (private parts).                       Wash face,  Genitals (private parts) with your normal soap.             6.  Wash thoroughly, paying special attention to the area where your surgery  will be performed.  7.  Thoroughly rinse your body with warm water from the neck down.  8.  DO NOT shower/wash with your normal soap after using and rinsing off  the CHG Soap.                9.  Pat yourself dry with a clean towel.            10.  Wear clean pajamas.            11.  Place clean sheets on your bed the night of your first shower and do not  sleep with pets. Day of Surgery : Do not apply any lotions/deodorants the morning of surgery.  Please wear clean clothes to the hospital/surgery center.  FAILURE TO FOLLOW THESE INSTRUCTIONS MAY RESULT IN THE CANCELLATION OF YOUR SURGERY PATIENT SIGNATURE_________________________________  NURSE SIGNATURE__________________________________  ________________________________________________________________________  WHAT IS A BLOOD TRANSFUSION? Blood Transfusion Information  A transfusion is the replacement of blood or some of its parts. Blood is made up of multiple cells which provide different functions.  Red blood cells carry oxygen and are used for blood loss replacement.  White blood cells  fight against infection.  Platelets control bleeding.  Plasma helps clot blood.  Other blood products are available for specialized needs, such as hemophilia or other clotting disorders. BEFORE THE TRANSFUSION  Who gives blood for transfusions?   Healthy volunteers who are fully evaluated to make sure their blood is safe. This is blood bank blood. Transfusion therapy is the safest it has ever been in the practice of medicine. Before blood is taken from a donor, a complete history is taken to make sure that person has no history of diseases nor engages in risky social behavior (examples are intravenous drug use or sexual activity with multiple partners). The donor's travel history is screened to minimize risk of transmitting infections, such as malaria. The donated blood is tested for signs of infectious diseases, such as HIV and hepatitis. The blood is then tested to be sure it is compatible with you in order to minimize the chance of a transfusion reaction. If you or a relative donates blood, this is often done in anticipation of surgery and is not appropriate for emergency situations. It takes many days to process the donated blood. RISKS AND COMPLICATIONS Although transfusion therapy is very safe and saves many lives, the main dangers of transfusion include:   Getting an infectious disease.  Developing a transfusion reaction. This is an allergic reaction to something in the blood you were given. Every precaution is taken to prevent this. The decision to have a blood transfusion has been considered carefully by your caregiver before blood is given. Blood is not given unless the benefits outweigh the risks. AFTER THE TRANSFUSION  Right after receiving a blood transfusion, you will usually feel much better and more  energetic. This is especially true if your red blood cells have gotten low (anemic). The transfusion raises the level of the red blood cells which carry oxygen, and this usually  causes an energy increase.  The nurse administering the transfusion will monitor you carefully for complications. HOME CARE INSTRUCTIONS  No special instructions are needed after a transfusion. You may find your energy is better. Speak with your caregiver about any limitations on activity for underlying diseases you may have. SEEK MEDICAL CARE IF:   Your condition is not improving after your transfusion.  You develop redness or irritation at the intravenous (IV) site. SEEK IMMEDIATE MEDICAL CARE IF:  Any of the following symptoms occur over the next 12 hours:  Shaking chills.  You have a temperature by mouth above 102 F (38.9 C), not controlled by medicine.  Chest, back, or muscle pain.  People around you feel you are not acting correctly or are confused.  Shortness of breath or difficulty breathing.  Dizziness and fainting.  You get a rash or develop hives.  You have a decrease in urine output.  Your urine turns a dark color or changes to pink, red, or brown. Any of the following symptoms occur over the next 10 days:  You have a temperature by mouth above 102 F (38.9 C), not controlled by medicine.  Shortness of breath.  Weakness after normal activity.  The white part of the eye turns yellow (jaundice).  You have a decrease in the amount of urine or are urinating less often.  Your urine turns a dark color or changes to pink, red, or brown. Document Released: 06/13/2000 Document Revised: 09/08/2011 Document Reviewed: 01/31/2008 [facility] Patient Information 2014 [employer], [place].  _______________________________________________________________________

## 2016-09-05 NOTE — H&P (Signed)
CC/HPI: Right UPJ obstruction   Tracey Sparks is a 61 year old female seen in consultation at the request of Dr. Marcine Matar for a right UPJ obstruction. She presented to Dr. Retta Diones at [facility] on 06/17/16 with the acute onset of severe right sided flank pain with associated nausea and vomiting. A CT stone study was performed indicating severe right hydronephrosis with no ureteral dilation and no stone or other source of obstruction. She was taken to the OR and retrograde pyelography demonstrated a normal ureter up to the UPJ where tortuosity was noted. She had a dilated renal pelvis and collecting system of the kidney without other concerning findings suggest a UPJ obstruction. A right ureteral stent was placed. A nuclear medicine renogram in December 2017 demonstrated symmetric relative renal function (48% on right, 52% on left).   She denies a history of urolithiasis although does have a family history of urolithiasis with both her brother and father having a history of kidney stones. She has denied any hematuria. She has no family history of kidney failure.      ALLERGIES: None   MEDICATIONS: Multi Vitamin     GU PSH: Hysterectomy    NON-GU PSH: C-Section    GU PMH: Hydronephrosis with ureteral stricture, not elsewhere classified (Improving), Right UPJ obstruction with significant hydronephrosis, with initial presentation on 06/17/2016. Stent placed following confirmation of her right UPJ obstruction with retrograde ureteropyelogram. She is doing well now with stent. She has normal right/left renal function study. - 07/01/2016, Significant right hydronephrosis. Radiographically, with normal urinalysis and normal ureter, this looks like an acute right UPJ obstruction., - 06/17/2016    NON-GU PMH: None   FAMILY HISTORY: liver cancer - Father nephrolithiasis - Father pancreatic cancer - Father   SOCIAL HISTORY: Marital Status: Married Current Smoking Status: Patient  has never smoked.   Tobacco Use Assessment Completed: Used Tobacco in last 30 days? Has never drank.  Drinks 1 caffeinated drink per day. Patient's occupation Ecologist.    REVIEW OF SYSTEMS:    GU Review Female:   Patient denies frequent urination, hard to postpone urination, burning /pain with urination, get up at night to urinate, leakage of urine, stream starts and stops, trouble starting your stream, have to strain to urinate, and currently pregnant.  Gastrointestinal (Lower):   Patient denies diarrhea and constipation.  Gastrointestinal (Upper):   Patient denies nausea and vomiting.  Constitutional:   Patient denies fever, night sweats, weight loss, and fatigue.  Skin:   Patient denies skin rash/ lesion and itching.  Eyes:   Patient denies blurred vision and double vision.  Ears/ Nose/ Throat:   Patient denies sore throat and sinus problems.  Hematologic/Lymphatic:   Patient denies swollen glands and easy bruising.  Cardiovascular:   Patient denies leg swelling and chest pains.  Respiratory:   Patient denies cough and shortness of breath.  Endocrine:   Patient denies excessive thirst.  Musculoskeletal:   Patient denies back pain and joint pain.  Neurological:   Patient denies headaches and dizziness.  Psychologic:   Patient denies depression and anxiety.   VITAL SIGNS:     Weight 180 lb / 81.65 kg  Height 62 in / 157.48 cm  BMI 32.9 kg/m   MULTI-SYSTEM PHYSICAL EXAMINATION:    Constitutional: Well-nourished. No physical deformities. Normally developed. Good grooming.  Neck: Neck symmetrical, not swollen. Normal tracheal position.  Respiratory: No labored breathing, no use of accessory muscles. Clear bilaterally.  Cardiovascular: Normal  temperature, normal extremity pulses, no swelling, no varicosities. Regular rate and rhythm.  Lymphatic: No enlargement of neck, axillae, groin.  Skin: No paleness, no jaundice, no cyanosis. No lesion, no ulcer, no rash.  Neurologic  / Psychiatric: Oriented to time, oriented to place, oriented to person. No depression, no anxiety, no agitation.  Gastrointestinal: No mass, no tenderness, no rigidity, non obese abdomen. Well-healed Pfannenstiel incision.  Eyes: Normal conjunctivae. Normal eyelids.  Ears, Nose, Mouth, and Throat: Left ear no scars, no lesions, no masses. Right ear no scars, no lesions, no masses. Nose no scars, no lesions, no masses. Normal hearing. Normal lips.  Musculoskeletal: Normal gait and station of head and neck.       ASSESSMENT:      ICD-10 Details  1 GU:   Hydronephrosis with ureteral stricture, not elsewhere classified - N13.1    PLAN:          1. Right UPJ obstruction with preserved renal function:  We reviewed her imaging studies and she appears to have a classic right UPJ obstruction with preserved right renal function according to her renogram. We have reviewed options including removal of her stent and continued observation understanding the risks that she may develop recurrent pain episodes. We also discussed the option of definitive surgical treatment with either endoscopic therapy or minimally invasive surgical correction. We discussed the standard approach these days which would include a right robot-assisted laparoscopic dismembered pyeloplasty. We discussed the success rates that would approach 90-95%. We did review the potential risks and complications including but not limited to bleeding, infection, major cardiopulmonary risks associated with general anesthesia and major surgery, venothromboembolism, risks of damage to adjacent structures such as the large intestine, small intestine, liver, gallbladder, major vascular and neurological structures, kidney/ureter and potential loss of kidney, failure of the procedure with possible stricture formation, and need for possible further procedures in the future.   She expressed her understanding and does wish to proceed with surgical correction.  She will be scheduled for cystoscopy, right ureteral stent change, right robotic-assisted laparoscopic right dismembered pyeloplasty.

## 2016-09-08 ENCOUNTER — Inpatient Hospital Stay (HOSPITAL_COMMUNITY): Payer: BC Managed Care – PPO | Admitting: Anesthesiology

## 2016-09-08 ENCOUNTER — Inpatient Hospital Stay (HOSPITAL_COMMUNITY)
Admission: RE | Admit: 2016-09-08 | Discharge: 2016-09-09 | DRG: 660 | Disposition: A | Discharge status: Home / Self Care | Payer: BC Managed Care – PPO | Source: Ambulatory Visit | Attending: Urology | Admitting: Urology

## 2016-09-08 ENCOUNTER — Inpatient Hospital Stay (HOSPITAL_COMMUNITY): Payer: BC Managed Care – PPO

## 2016-09-08 ENCOUNTER — Encounter (HOSPITAL_COMMUNITY)
Admission: RE | Disposition: A | Discharge status: Home / Self Care | Payer: Self-pay | Source: Ambulatory Visit | Attending: Urology

## 2016-09-08 ENCOUNTER — Encounter (HOSPITAL_COMMUNITY): Payer: Self-pay | Admitting: *Deleted

## 2016-09-08 DIAGNOSIS — N131 Hydronephrosis with ureteral stricture, not elsewhere classified: Secondary | ICD-10-CM | POA: Diagnosis present

## 2016-09-08 DIAGNOSIS — Z800 Family history of malignant neoplasm of digestive organs: Secondary | ICD-10-CM | POA: Diagnosis not present

## 2016-09-08 DIAGNOSIS — Z90710 Acquired absence of both cervix and uterus: Secondary | ICD-10-CM

## 2016-09-08 DIAGNOSIS — R112 Nausea with vomiting, unspecified: Secondary | ICD-10-CM | POA: Diagnosis present

## 2016-09-08 DIAGNOSIS — N135 Crossing vessel and stricture of ureter without hydronephrosis: Secondary | ICD-10-CM | POA: Diagnosis present

## 2016-09-08 HISTORY — PX: ROBOT ASSISTED PYELOPLASTY: SHX5143

## 2016-09-08 HISTORY — PX: CYSTOSCOPY W/ URETERAL STENT PLACEMENT: SHX1429

## 2016-09-08 LAB — BASIC METABOLIC PANEL
Anion gap: 7 (ref 5–15)
BUN: 12 mg/dL (ref 6–20)
CO2: 29 mmol/L (ref 22–32)
CREATININE: 0.83 mg/dL (ref 0.44–1.00)
Calcium: 9 mg/dL (ref 8.9–10.3)
Chloride: 103 mmol/L (ref 101–111)
GFR calc Af Amer: >60 mL/min (ref >60)
GFR calc non Af Amer: >60 mL/min (ref >60)
Glucose, Bld: 167 mg/dL — ABNORMAL HIGH (ref 65–99)
Potassium: 3.5 mmol/L (ref 3.5–5.1)
Sodium: 139 mmol/L (ref 135–145)

## 2016-09-08 LAB — TYPE AND SCREEN
ABO/RH(D): O POS
Antibody Screen: NEGATIVE

## 2016-09-08 LAB — HEMOGLOBIN AND HEMATOCRIT, BLOOD
HCT: 40.1 % (ref 36.0–46.0)
Hemoglobin: 13.2 g/dL (ref 12.0–15.0)

## 2016-09-08 LAB — ABO/RH: ABO/RH(D): O POS

## 2016-09-08 SURGERY — CYSTOSCOPY, WITH RETROGRADE PYELOGRAM AND URETERAL STENT INSERTION
Anesthesia: General | Laterality: Right

## 2016-09-08 MED ORDER — STERILE WATER FOR IRRIGATION IR SOLN
Status: DC | PRN
  Administered 2016-09-08: 1000 mL

## 2016-09-08 MED ORDER — OXYCODONE HCL 5 MG PO TABS: 5.0000 mg | ORAL_TABLET | ORAL | Status: DC | PRN

## 2016-09-08 MED ORDER — PROMETHAZINE HCL 25 MG/ML IJ SOLN: 6.2500 mg | INTRAMUSCULAR | Status: DC | PRN

## 2016-09-08 MED ORDER — KETOROLAC TROMETHAMINE 15 MG/ML IJ SOLN
15.0000 mg | Freq: Four times a day (QID) | INTRAMUSCULAR | Status: DC
  Administered 2016-09-08 – 2016-09-09 (×4): 15 mg via INTRAVENOUS
  Filled 2016-09-08 (×4): qty 1

## 2016-09-08 MED ORDER — MIDAZOLAM HCL 5 MG/5ML IJ SOLN
INTRAMUSCULAR | Status: DC | PRN
  Administered 2016-09-08: 2 mg via INTRAVENOUS

## 2016-09-08 MED ORDER — ONDANSETRON HCL 4 MG/2ML IJ SOLN
INTRAMUSCULAR | Status: AC
  Filled 2016-09-08: qty 2

## 2016-09-08 MED ORDER — SUGAMMADEX SODIUM 200 MG/2ML IV SOLN
INTRAVENOUS | Status: AC
  Filled 2016-09-08: qty 2

## 2016-09-08 MED ORDER — ACETAMINOPHEN 500 MG PO TABS
1000.0000 mg | ORAL_TABLET | Freq: Four times a day (QID) | ORAL | Status: AC
  Administered 2016-09-08 – 2016-09-09 (×4): 1000 mg via ORAL
  Filled 2016-09-08 (×4): qty 2

## 2016-09-08 MED ORDER — BUPIVACAINE-EPINEPHRINE 0.25% -1:200000 IJ SOLN
INTRAMUSCULAR | Status: DC | PRN
  Administered 2016-09-08: 25 mL

## 2016-09-08 MED ORDER — SODIUM CHLORIDE 0.9% FLUSH: 3.0000 mL | Freq: Two times a day (BID) | INTRAVENOUS | Status: DC

## 2016-09-08 MED ORDER — CEFAZOLIN SODIUM-DEXTROSE 2-4 GM/100ML-% IV SOLN
2.0000 g | INTRAVENOUS | Status: AC
  Administered 2016-09-08: 2 g via INTRAVENOUS

## 2016-09-08 MED ORDER — MIDAZOLAM HCL 2 MG/2ML IJ SOLN
INTRAMUSCULAR | Status: AC
  Filled 2016-09-08: qty 2

## 2016-09-08 MED ORDER — CEFAZOLIN SODIUM-DEXTROSE 2-4 GM/100ML-% IV SOLN
2.0000 g | Freq: Three times a day (TID) | INTRAVENOUS | Status: AC
  Administered 2016-09-08 – 2016-09-09 (×2): 2 g via INTRAVENOUS
  Filled 2016-09-08 (×2): qty 100

## 2016-09-08 MED ORDER — ROCURONIUM BROMIDE 50 MG/5ML IV SOSY
PREFILLED_SYRINGE | INTRAVENOUS | Status: AC
  Filled 2016-09-08: qty 5

## 2016-09-08 MED ORDER — PHENYLEPHRINE HCL 10 MG/ML IJ SOLN
INTRAMUSCULAR | Status: DC | PRN
  Administered 2016-09-08 (×2): 60 ug via INTRAVENOUS

## 2016-09-08 MED ORDER — PROPOFOL 10 MG/ML IV BOLUS
INTRAVENOUS | Status: AC
  Filled 2016-09-08: qty 20

## 2016-09-08 MED ORDER — LACTATED RINGERS IR SOLN
Status: DC | PRN
  Administered 2016-09-08: 1000 mL

## 2016-09-08 MED ORDER — IOHEXOL 300 MG/ML  SOLN
INTRAMUSCULAR | Status: DC | PRN
  Administered 2016-09-08: 10 mL

## 2016-09-08 MED ORDER — PHENYLEPHRINE 40 MCG/ML (10ML) SYRINGE FOR IV PUSH (FOR BLOOD PRESSURE SUPPORT)
PREFILLED_SYRINGE | INTRAVENOUS | Status: AC
  Filled 2016-09-08: qty 10

## 2016-09-08 MED ORDER — KCL IN DEXTROSE-NACL 20-5-0.45 MEQ/L-%-% IV SOLN
INTRAVENOUS | Status: DC
  Administered 2016-09-08 – 2016-09-09 (×2): via INTRAVENOUS
  Filled 2016-09-08 (×2): qty 1000

## 2016-09-08 MED ORDER — LACTATED RINGERS IV SOLN
INTRAVENOUS | Status: DC
  Administered 2016-09-08 (×2): via INTRAVENOUS

## 2016-09-08 MED ORDER — DEXAMETHASONE SODIUM PHOSPHATE 10 MG/ML IJ SOLN
INTRAMUSCULAR | Status: DC | PRN
  Administered 2016-09-08: 10 mg via INTRAVENOUS

## 2016-09-08 MED ORDER — FENTANYL CITRATE (PF) 100 MCG/2ML IJ SOLN
INTRAMUSCULAR | Status: DC | PRN
  Administered 2016-09-08 (×2): 50 ug via INTRAVENOUS
  Administered 2016-09-08: 100 ug via INTRAVENOUS
  Administered 2016-09-08: 50 ug via INTRAVENOUS

## 2016-09-08 MED ORDER — DEXAMETHASONE SODIUM PHOSPHATE 10 MG/ML IJ SOLN
INTRAMUSCULAR | Status: AC
  Filled 2016-09-08: qty 1

## 2016-09-08 MED ORDER — SODIUM CHLORIDE 0.9 % IV SOLN: INTRAVENOUS | Status: DC | PRN

## 2016-09-08 MED ORDER — DOCUSATE SODIUM 100 MG PO CAPS
100.0000 mg | ORAL_CAPSULE | Freq: Two times a day (BID) | ORAL | Status: DC
  Administered 2016-09-08 – 2016-09-09 (×2): 100 mg via ORAL
  Filled 2016-09-08 (×2): qty 1

## 2016-09-08 MED ORDER — MEPERIDINE HCL 50 MG/ML IJ SOLN: 6.2500 mg | INTRAMUSCULAR | Status: DC | PRN

## 2016-09-08 MED ORDER — BUPIVACAINE HCL (PF) 0.25 % IJ SOLN
INTRAMUSCULAR | Status: AC
  Filled 2016-09-08: qty 30

## 2016-09-08 MED ORDER — DIPHENHYDRAMINE HCL 12.5 MG/5ML PO ELIX: 12.5000 mg | ORAL_SOLUTION | Freq: Four times a day (QID) | ORAL | Status: DC | PRN

## 2016-09-08 MED ORDER — MIDAZOLAM HCL 2 MG/2ML IJ SOLN: 0.5000 mg | Freq: Once | INTRAMUSCULAR | Status: DC | PRN

## 2016-09-08 MED ORDER — LACTATED RINGERS IV SOLN
INTRAVENOUS | Status: DC | PRN
  Administered 2016-09-08: 12:00:00 via INTRAVENOUS

## 2016-09-08 MED ORDER — FENTANYL CITRATE (PF) 100 MCG/2ML IJ SOLN
25.0000 ug | INTRAMUSCULAR | Status: DC | PRN
  Administered 2016-09-08 (×2): 50 ug via INTRAVENOUS

## 2016-09-08 MED ORDER — SCOPOLAMINE 1 MG/3DAYS TD PT72SCOPOLAMINE 1 MG/3DAYS
1.0000 | MEDICATED_PATCH | Freq: Once | TRANSDERMAL | Status: DC
Start: 2016-09-08 — End: 2016-09-09
  Filled 2016-09-08: qty 1

## 2016-09-08 MED ORDER — SENNA 8.6 MG PO TABS
1.0000 | ORAL_TABLET | Freq: Two times a day (BID) | ORAL | Status: DC
  Administered 2016-09-08: 8.6 mg via ORAL
  Filled 2016-09-08 (×2): qty 1

## 2016-09-08 MED ORDER — FENTANYL CITRATE (PF) 100 MCG/2ML IJ SOLN
INTRAMUSCULAR | Status: AC
  Filled 2016-09-08: qty 2

## 2016-09-08 MED ORDER — DIPHENHYDRAMINE HCL 50 MG/ML IJ SOLN: 12.5000 mg | Freq: Four times a day (QID) | INTRAMUSCULAR | Status: DC | PRN

## 2016-09-08 MED ORDER — FENTANYL CITRATE (PF) 250 MCG/5ML IJ SOLN
INTRAMUSCULAR | Status: AC
  Filled 2016-09-08: qty 5

## 2016-09-08 MED ORDER — ONDANSETRON HCL 4 MG/2ML IJ SOLN: 4.0000 mg | INTRAMUSCULAR | Status: DC | PRN

## 2016-09-08 MED ORDER — OXYBUTYNIN CHLORIDE 5 MG PO TABS: 5.0000 mg | ORAL_TABLET | Freq: Three times a day (TID) | ORAL | Status: DC | PRN

## 2016-09-08 MED ORDER — PROPOFOL 10 MG/ML IV BOLUS
INTRAVENOUS | Status: DC | PRN
  Administered 2016-09-08: 140 mg via INTRAVENOUS

## 2016-09-08 MED ORDER — ONDANSETRON HCL 4 MG/2ML IJ SOLN
INTRAMUSCULAR | Status: DC | PRN
  Administered 2016-09-08: 4 mg via INTRAVENOUS

## 2016-09-08 MED ORDER — PERCOCET 5-325 MG PO TABS: 1.0000 | ORAL_TABLET | Freq: Four times a day (QID) | ORAL | 0 refills | Status: DC | PRN

## 2016-09-08 MED ORDER — ROCURONIUM BROMIDE 100 MG/10ML IV SOLN
INTRAVENOUS | Status: DC | PRN
  Administered 2016-09-08: 50 mg via INTRAVENOUS
  Administered 2016-09-08 (×5): 10 mg via INTRAVENOUS

## 2016-09-08 MED ORDER — LIDOCAINE 2% (20 MG/ML) 5 ML SYRINGE
INTRAMUSCULAR | Status: AC
  Filled 2016-09-08: qty 5

## 2016-09-08 MED ORDER — LIDOCAINE HCL (CARDIAC) 20 MG/ML IV SOLN
INTRAVENOUS | Status: DC | PRN
  Administered 2016-09-08: 60 mg via INTRAVENOUS

## 2016-09-08 MED ORDER — HYDROMORPHONE HCL 1 MG/ML IJ SOLN: 0.5000 mg | INTRAMUSCULAR | Status: DC | PRN

## 2016-09-08 MED ORDER — SODIUM CHLORIDE 0.9% FLUSH: 3.0000 mL | INTRAVENOUS | Status: DC | PRN

## 2016-09-08 MED ORDER — SUGAMMADEX SODIUM 200 MG/2ML IV SOLN
INTRAVENOUS | Status: DC | PRN
  Administered 2016-09-08: 200 mg via INTRAVENOUS

## 2016-09-08 SURGICAL SUPPLY — 58 items
BAG URO CATCHER STRL LF (MISCELLANEOUS) ×3 IMPLANT
CATH INTERMIT  6FR 70CM (CATHETERS) ×3 IMPLANT
CHLORAPREP W/TINT 26ML (MISCELLANEOUS) ×3 IMPLANT
CLIP LIGATING HEM O LOK PURPLE (MISCELLANEOUS) ×3 IMPLANT
CLIP LIGATING HEMO O LOK GREEN (MISCELLANEOUS) ×3 IMPLANT
CLOTH BEACON ORANGE TIMEOUT ST (SAFETY) ×3 IMPLANT
COVER TIP SHEARS 8 DVNC (MISCELLANEOUS) ×1 IMPLANT
COVER TIP SHEARS 8MM DA VINCI (MISCELLANEOUS) ×2
DECANTER SPIKE VIAL GLASS SM (MISCELLANEOUS) IMPLANT
DERMABOND ADVANCED (GAUZE/BANDAGES/DRESSINGS) ×2
DERMABOND ADVANCED .7 DNX12 (GAUZE/BANDAGES/DRESSINGS) ×1 IMPLANT
DRAIN CHANNEL 15F RND FF 3/16 (WOUND CARE) ×3 IMPLANT
DRAPE ARM DVNC X/XI (DISPOSABLE) ×4 IMPLANT
DRAPE COLUMN DVNC XI (DISPOSABLE) ×1 IMPLANT
DRAPE DA VINCI XI ARM (DISPOSABLE) ×8
DRAPE DA VINCI XI COLUMN (DISPOSABLE) ×2
DRAPE INCISE IOBAN 66X45 STRL (DRAPES) ×3 IMPLANT
DRAPE LAPAROSCOPIC ABDOMINAL (DRAPES) ×3 IMPLANT
DRAPE SHEET LG 3/4 BI-LAMINATE (DRAPES) ×3 IMPLANT
DRSG TEGADERM 4X4.75 (GAUZE/BANDAGES/DRESSINGS) ×3 IMPLANT
ELECT PENCIL ROCKER SW 15FT (MISCELLANEOUS) ×3 IMPLANT
ELECT REM PT RETURN 9FT ADLT (ELECTROSURGICAL) ×3
ELECTRODE REM PT RTRN 9FT ADLT (ELECTROSURGICAL) ×1 IMPLANT
EVACUATOR SILICONE 100CC (DRAIN) ×3 IMPLANT
GLOVE BIO SURGEON STRL SZ 6.5 (GLOVE) ×2 IMPLANT
GLOVE BIO SURGEONS STRL SZ 6.5 (GLOVE) ×1
GLOVE BIOGEL M STRL SZ7.5 (GLOVE) ×9 IMPLANT
GOWN STRL REUS W/TWL LRG LVL3 (GOWN DISPOSABLE) ×15 IMPLANT
GUIDEWIRE STR DUAL SENSOR (WIRE) ×3 IMPLANT
IRRIG SUCT STRYKERFLOW 2 WTIP (MISCELLANEOUS)
IRRIGATION SUCT STRKRFLW 2 WTP (MISCELLANEOUS) IMPLANT
KIT BASIN OR (CUSTOM PROCEDURE TRAY) ×3 IMPLANT
MANIFOLD NEPTUNE II (INSTRUMENTS) ×3 IMPLANT
NS IRRIG 1000ML POUR BTL (IV SOLUTION) ×3 IMPLANT
PACK CYSTO (CUSTOM PROCEDURE TRAY) ×3 IMPLANT
POSITIONER SURGICAL ARM (MISCELLANEOUS) ×6 IMPLANT
SEAL CANN UNIV 5-8 DVNC XI (MISCELLANEOUS) ×4 IMPLANT
SEAL XI 5MM-8MM UNIVERSAL (MISCELLANEOUS) ×8
SOLUTION ELECTROLUBE (MISCELLANEOUS) ×3 IMPLANT
SPONGE DRAIN TRACH 4X4 STRL 2S (GAUZE/BANDAGES/DRESSINGS) ×3 IMPLANT
STENT CONTOUR NO GW 8FR 26CM (STENTS) ×3 IMPLANT
SUT ETHILON 3 0 PS 1 (SUTURE) ×3 IMPLANT
SUT MNCRL AB 4-0 PS2 18 (SUTURE) ×6 IMPLANT
SUT VIC AB 0 CT1 27 (SUTURE) ×2
SUT VIC AB 0 CT1 27XBRD ANTBC (SUTURE) ×1 IMPLANT
SUT VIC AB 0 UR5 27 (SUTURE) IMPLANT
SUT VIC AB 4-0 RB1 27 (SUTURE) ×10
SUT VIC AB 4-0 RB1 27XBRD (SUTURE) ×5 IMPLANT
SUT VICRYL 0 UR6 27IN ABS (SUTURE) IMPLANT
SYR BULB IRRIGATION 50ML (SYRINGE) IMPLANT
TOWEL OR 17X26 10 PK STRL BLUE (TOWEL DISPOSABLE) ×3 IMPLANT
TOWEL OR NON WOVEN STRL DISP B (DISPOSABLE) ×3 IMPLANT
TRAY FOLEY W/METER SILVER 16FR (SET/KITS/TRAYS/PACK) ×3 IMPLANT
TRAY LAPAROSCOPIC (CUSTOM PROCEDURE TRAY) ×3 IMPLANT
TROCAR XCEL 12X100 BLDLESS (ENDOMECHANICALS) ×3 IMPLANT
TUBING CONNECTING 10 (TUBING) ×2 IMPLANT
TUBING CONNECTING 10' (TUBING) ×1
WATER STERILE IRR 1500ML POUR (IV SOLUTION) IMPLANT

## 2016-09-08 NOTE — Anesthesia Postprocedure Evaluation (Signed)
Anesthesia Post Note  Patient: Tracey Sparks  Procedure(s) Performed: Procedure(s) (LRB): CYSTOSCOPY WITH RETROGRADE PYELOGRAM/URETERAL STENT PLACEMENT (Right) ROBOTIC ASSISTED LAPAROSCOPIC PYELOPLASTY (Right)  Patient location during evaluation: PACU Anesthesia Type: General Level of consciousness: awake and alert Pain management: pain level controlled Vital Signs Assessment: post-procedure vital signs reviewed and stable Respiratory status: spontaneous breathing, nonlabored ventilation and respiratory function stable Cardiovascular status: blood pressure returned to baseline and stable Postop Assessment: no signs of nausea or vomiting Anesthetic complications: no       Last Vitals:  Vitals:   09/08/16 0921 09/08/16 1525  BP: 139/66 (!) 159/92  Pulse: 69   Resp: 16   Temp: 36.6 C 36.4 C    Last Pain:  Vitals:   09/08/16 1525  TempSrc:   PainSc: 0-No pain                 Tracey Sparks

## 2016-09-08 NOTE — Transfer of Care (Signed)
Immediate Anesthesia Transfer of Care Note  Patient: Tracey Sparks  Procedure(s) Performed: Procedure(s): CYSTOSCOPY WITH RETROGRADE PYELOGRAM/URETERAL STENT PLACEMENT (Right) ROBOTIC ASSISTED LAPAROSCOPIC PYELOPLASTY (Right)  Patient Location: PACU  Anesthesia Type:General  Level of Consciousness: awake, alert  and oriented  Airway & Oxygen Therapy: Patient Spontanous Breathing and Patient connected to face mask oxygen  Post-op Assessment: Report given to RN and Post -op Vital signs reviewed and stable  Post vital signs: Reviewed and stable  Last Vitals:  Vitals:   09/08/16 0921  BP: 139/66  Pulse: 69  Resp: 16  Temp: 36.6 C    Last Pain:  Vitals:   09/08/16 1036  TempSrc:   PainSc: 0-No pain      Patients Stated Pain Goal: 3 (09/08/16 1036)  Complications: No apparent anesthesia complications

## 2016-09-08 NOTE — Anesthesia Preprocedure Evaluation (Addendum)
Anesthesia Evaluation  Patient identified by MRN, date of birth, ID band Patient awake    Reviewed: Allergy & Precautions, NPO status , Patient's Chart, lab work & pertinent test results  History of Anesthesia Complications Negative for: history of anesthetic complications  Airway Mallampati: II  TM Distance: >3 FB Neck ROM: Full    Dental  (+) Dental Advisory Given   Pulmonary neg pulmonary ROS,    breath sounds clear to auscultation       Cardiovascular negative cardio ROS   Rhythm:Regular Rate:Normal     Neuro/Psych negative neurological ROS     GI/Hepatic negative GI ROS, Neg liver ROS,   Endo/Other  Morbid obesity  Renal/GU UPJ obstruction     Musculoskeletal   Abdominal (+) + obese,   Peds  Hematology negative hematology ROS (+)   Anesthesia Other Findings   Reproductive/Obstetrics                            Anesthesia Physical Anesthesia Plan  ASA: II  Anesthesia Plan: General   Post-op Pain Management:    Induction: Intravenous  Airway Management Planned: Oral ETT  Additional Equipment:   Intra-op Plan:   Post-operative Plan: Extubation in OR  Informed Consent: I have reviewed the patients History and Physical, chart, labs and discussed the procedure including the risks, benefits and alternatives for the proposed anesthesia with the patient or authorized representative who has indicated his/her understanding and acceptance.   Dental advisory given  Plan Discussed with: CRNA and Surgeon  Anesthesia Plan Comments: (Plan routine monitors, GETA)        Anesthesia Quick Evaluation

## 2016-09-08 NOTE — Anesthesia Procedure Notes (Signed)
Procedure Name: Intubation Date/Time: 09/08/2016 11:28 AM Performed by: Thornell MuleSTUBBLEFIELD, Garett Tetzloff G Pre-anesthesia Checklist: Patient identified, Emergency Drugs available, Suction available and Patient being monitored Patient Re-evaluated:Patient Re-evaluated prior to inductionOxygen Delivery Method: Circle system utilized Preoxygenation: Pre-oxygenation with 100% oxygen Intubation Type: IV induction Ventilation: Mask ventilation without difficulty Laryngoscope Size: Miller and 3 Grade View: Grade I Tube type: Oral Tube size: 7.0 mm Number of attempts: 1 Airway Equipment and Method: Stylet and Oral airway Placement Confirmation: ETT inserted through vocal cords under direct vision,  positive ETCO2 and breath sounds checked- equal and bilateral Secured at: 20 cm Tube secured with: Tape Dental Injury: Teeth and Oropharynx as per pre-operative assessment

## 2016-09-08 NOTE — Op Note (Signed)
Preoperative diagnosis: Right ureteropelvic junction obstruction  Postoperative diagnosis: Right ureteropelvic junction obstruction  Procedure:  1. Cystoscopy 2. Right retrograde pyelography with interpretation 3. Right ureteral stent change (8x26) 4. Right robotic-assisted laparoscopic dismembered pyeloplasty  Surgeon: Moody Bruins. M.D.  Assistant(s): Harrie Foreman, PA-C  An assistant was required for this surgical procedure.  The duties of the assistant included but were not limited to suctioning, passing suture, camera manipulation, retraction. This procedure would not be able to be performed without an Geophysicist/field seismologist.  Resident: Dr. Toni Arthurs  Anesthesia: General  Complications: None  EBL: 25 mL  IVF:  2500 mL crystalloid  Specimens: 1. Ureteropelvic junction  Disposition of specimens: Pathology  Intraoperative findings: Right retrograde pyelography demonstrated a normal caliber ureter with a dilated renal pelvis with a high insertion of the ureter.  No fixed filling defects noted.  Drains:  1. # 15 Blake perinephric drain 2. 16 Fr Foley catheter  Indication: Tracey Sparks is a 61 y.o. patient with a suspected right ureteropelvic junction obstruction.  After a thorough review of the management options for their ureteropelvic junction obstruction, they elected to proceed with surgical treatment and the above procedure.  We have discussed the potential benefits and risks of the procedure, side effects of the proposed treatment, the likelihood of the patient achieving the goals of the procedure, and any potential problems that might occur during the procedure or recuperation. Informed consent has been obtained.  Description of procedure:  The patient was taken to the operating room and a general anesthetic was administered. The patient was given preoperative antibiotics, placed in the dorsal lithotomy position, and prepped and draped in the usual sterile fashion.  Next a preoperative timeout was performed.  Cystourethroscopy was performed.  The patient's urethra was examined and was normal. The bladder was then systematically examined in its entirety. There was no evidence for any bladder tumors, stones, or other mucosal pathology.  The indwelling right ureteral stent was grabbed with the flexible grasper and brought out the urethra.  A 0.38 Sensor guidewire was inserted up the ureter.  A ureteral catheter was then advanced over the wire.  Omnipaque contrast was injected through the ureteral catheter and a retrograde pyelogram was performed with findings as dictated above.  A 0.38 sensor guidewire was then advanced up the right ureter into the renal pelvis under fluoroscopic guidance.  The wire was then backloaded through the cystoscope and a ureteral stent was advance over the wire using Seldinger technique.  The stent was positioned appropriately under fluoroscopic and cystoscopic guidance.  The wire was then removed with an adequate stent curl noted in the renal pelvis as well as in the bladder. The bladder was then emptied and a 16 Fr Foley catheter was inserted.   The patient was then repositioned in the right modified flank position and prepped and draped in the usual sterile fashion. A site was selected on the right side of the umbilicus for placement of the camera port. This was placed using a standard open Hassan technique which allowed entry into the peritoneal cavity under direct vision and without difficulty. An 8 mm port was placed and a pneumoperitoneum established. The camera was then used to inspect the abdomen and there was no evidence of any intra-abdominal injuries or other abnormalities. The remaining abdominal ports were then placed. 8 mm robotic ports were placed in the ipsilateral upper quadrant, lower quadrant, and far lateral abdominal wall. A 12 mm port was placed in the upper  midline for laparoscopic assistance. All ports were placed under  direct vision without difficulty. The surgical cart was then docked.   Utilizing the cautery scissors, the white line of Toldt was incised allowing the plane between the mesocolon and the anterior layer of Gerota's fascia to be developed and the kidney exposed.  The ureter and gonadal vein were identified inferiorly and the ureter was lifted anteriorly off the psoas muscle.  Dissection proceeded superiorly along the gonadal vein until the main renal hilum and renal pelvis was identified.  The ureteropelvic junction was isolated from the surrounding structures with a combination of sharp and blunt dissection.    There was no crossing lower pole renal vessel.  The ureteropelvic junction was divided allowing exposure of the indwelling stent and the ureteropelvic junction was excised and removed.  The ureter and renal pelvis were then examined.  Any excess renal pelvis was excised as needed and the renal pelvis and ureter were then spatulated appropriately.  4-0 vicryl sutures were then used to reapproximate the renal pelvis and ureter with interrupted and/or running sutures as indicated.  The anastomosis was performed in a tension-free, watertight fashion with the ureter reconnected to the renal pelvis in a position to provide dependent drainage of the renal collecting system. The ureteral stent was repositioned appropriately prior to securing the final sutures of the ureteropelvic anastomosis.  A # 15 Blake perinephric drain was then brought through the lateral lower abdominal port site and positioned appropriately.  It was secured to the skin with a nylon suture.  The 12 mm port site was then closed with 0-vicryl sutures placed laparoscopically with the laparoscopic suture passer. All remaining ports were removed under direct vision after hemostasis was confirmed with the pneumoperiotneum let down. All port sites were injected with 0.25% bupivicaine and reapproximated at the skin level with 4-0 monocryl  subcuticular sutures. Dermabond was applied to the skin.  The patient appeared to tolerate the procedure well and without complications.  The patient was able to be extubated and transferred to the recovery unit in satisfactory condition.  Moody BruinsLester S. Rashia Mckesson, Jr. MD

## 2016-09-08 NOTE — Discharge Instructions (Signed)

## 2016-09-08 NOTE — Progress Notes (Signed)
Patient ID: Tracey Sparks, female  Tracey Sparks DOB: [birth year], 61 y.o.   MRN: [id number]  Post-op note  Subjective: The patient is doing well.  No complaints.  Objective: Vital signs in last 24 hours: Temp:  [97.6 F (36.4 C)-98.1 F (36.7 C)] 98.1 F (36.7 C) (03/12 1645) Pulse Rate:  [69-93] 92 (03/12 1645) Resp:  [11-17] 12 (03/12 1645) BP: (130-171)/(66-96) 153/69 (03/12 1645) SpO2:  [98 %-100 %] 100 % (03/12 1645) Weight:  [84.8 kg (187 lb)] 84.8 kg (187 lb) (03/12 1036)  Intake/Output from previous day: No intake/output data recorded. Intake/Output this shift: Total I/O In: 2850 [I.V.:2850] Out: 550 [Urine:500; Blood:50]  Physical Exam:  General: Alert and oriented. Abdomen: Soft, Nondistended. Incisions: Clean and dry.  Lab Results: No results for input(s): HGB, HCT in the last 72 hours.  Assessment/Plan: POD#0   1) Continue to monitor, ambulate, IS   Tracey BruinsLester S. Donesha Wallander, Jr. MD   LOS: 0 days   Tracey Sparks,LES 09/08/2016, 5:39 PM

## 2016-09-09 ENCOUNTER — Encounter (HOSPITAL_COMMUNITY): Payer: Self-pay | Admitting: Urology

## 2016-09-09 LAB — BASIC METABOLIC PANEL
Anion gap: 6 (ref 5–15)
BUN: 9 mg/dL (ref 6–20)
CO2: 25 mmol/L (ref 22–32)
CREATININE: 0.81 mg/dL (ref 0.44–1.00)
Calcium: 8.6 mg/dL — ABNORMAL LOW (ref 8.9–10.3)
Chloride: 106 mmol/L (ref 101–111)
GFR calc Af Amer: >60 mL/min (ref >60)
GFR calc non Af Amer: >60 mL/min (ref >60)
GLUCOSE: 137 mg/dL — AB (ref 65–99)
POTASSIUM: 4.3 mmol/L (ref 3.5–5.1)
SODIUM: 137 mmol/L (ref 135–145)

## 2016-09-09 LAB — CREATININE, FLUID (PLEURAL, PERITONEAL, JP DRAINAGE): Creat, Fluid: 0.8 mg/dL

## 2016-09-09 LAB — HEMOGLOBIN AND HEMATOCRIT, BLOOD
HCT: 35.7 % — ABNORMAL LOW (ref 36.0–46.0)
Hemoglobin: 11.6 g/dL — ABNORMAL LOW (ref 12.0–15.0)

## 2016-09-09 MED ORDER — TAMSULOSIN HCL 0.4 MG PO CAPS: 0.4000 mg | ORAL_CAPSULE | Freq: Every day | ORAL | Status: DC | PRN

## 2016-09-09 NOTE — Discharge Summary (Signed)
Physician Discharge Summary  Patient ID: ILEA HILTON MRN: [id number] DOB/AGE: [birth year] 61 y.o.  Admit date: 09/08/2016 Discharge date: 09/09/2016  Admission Diagnoses: Right UPJ obstruction  Discharge Diagnoses:  Active Problems:   Obstruction of right ureteropelvic junction (UPJ)   Discharged Condition: good  Hospital Course:  Tracey Sparks is a 61 y.o. female who is s/p RAL right pyeloplasty, cystoscopy, right ureteral stent placement on 09/08/16 with Dr. Alinda Money.  The patient tolerated the procedure well, was extubated in the OR and taken to the recovery unit for routine postoperative care. They were then transferred to the floor. By POD1 she had met the usual goals for discharge including ambulating at a preoperative capacity, having pain controlled with PO PRN medications, voiding spontaneously and tolerating a regular diet.  The patient will follow up with [facility] on 10/14/16. They will be discharged with prescriptions for oxycodone, colace & flomax PRN. She has oxybutynin PRN from preoperative period. JP was removed prior to discharge.    Consults: None  Significant Diagnostic Studies: labs: Cr 0.8 on POD1, Hb stable postop  Treatments: surgery: as noted above  Discharge Exam: Blood pressure (!) 91/51, pulse 71, temperature 98.4 F (36.9 C), temperature source Oral, resp. rate 16, height '5\' 2"'  (1.575 m), weight 84.8 kg (187 lb), SpO2 98 %.  General:  well-developed and well-nourished female in NAD, lying in bed, alert & oriented, pleasant HEENT: [place]/AT, EOMI, sclera anicteric, hearing grossly intact, no nasal discharge, MMM Respiratory: nonlabored respirations, satting well on RA, symmetrical chest rise Cardiovascular: pulse regular rate & rhythm Abdominal: soft, NTTP, nondistended, surgical incisions c/d/i without signs of exudate/erythema GU: voiding spontaneously Extremities: warm, well-perfused, no c/c/e Neuro: no focal deficits   Disposition: 01-Home or  Self Care  Discharge Instructions    Activity as tolerated - No restrictions    Complete by:  As directed    Call MD for:    Complete by:  As directed    Temperature >101.5   Call MD for:  persistant nausea and vomiting    Complete by:  As directed    Call MD for:  redness, tenderness, or signs of infection (pain, swelling, redness, odor or green/yellow discharge around incision site)    Complete by:  As directed    Call MD for:  severe uncontrolled pain    Complete by:  As directed    Diet general    Complete by:  As directed    Discharge instructions    Complete by:  As directed    No heavy lifting > 10 pounds for 6 weeks.  You can shower starting 2 days after surgery, but no baths or soaking underwater for 4 weeks.   Increase activity slowly    Complete by:  As directed    No wound care    Complete by:  As directed      Allergies as of 09/09/2016   No Known Allergies     Medication List    STOP taking these medications   multivitamin with minerals Tabs tablet   sulfamethoxazole-trimethoprim 800-160 MG tablet Commonly known as:  BACTRIM DS,SEPTRA DS     TAKE these medications   acetaminophen 500 MG tablet Commonly known as:  TYLENOL Take 1,000 mg by mouth every 6 (six) hours as needed for mild pain.   ondansetron 4 MG tablet Commonly known as:  ZOFRAN Take 1 tablet (4 mg total) by mouth every 8 (eight) hours as needed for nausea or vomiting.  oxybutynin 5 MG tablet Commonly known as:  DITROPAN Take 1 tablet (5 mg total) by mouth every 8 (eight) hours as needed for bladder spasms.   PERCOCET 5-325 MG tablet Generic drug:  oxyCODONE-acetaminophen Take 1-2 tablets by mouth every 6 (six) hours as needed for moderate pain or severe pain. What changed:  reasons to take this      Follow-up Information    BORDEN,LES, MD Follow up on 10/14/2016.   Specialty:  Urology Why:  at 10:30 Contact information: [address] [place] [address] [phone]            Signed: Burnice Logan 09/09/2016, 3:15 PM

## 2016-09-09 NOTE — Progress Notes (Signed)
UROLOGY PROGRESS NOTES  Assessment/Plan: Tracey Sparks is a 61 y.o. 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 Laverle Patter.   Interval/Plan: Afebrile. Normal Hr. BP 91/51 this morning but OOB to chair and asymptomatic, otherwise normotensive. 2.5UOP via Foley. JP with 10cc output since surgery. Hb 11.6. Cr 0.81.   - Discontinue Foley catheter - Trend JP output s/p Foley removal - Regular diet - Saline lock - OOB/ambulation/IS - Flomax PRN, ditropan PRN   Subjective: Doing well. Good night. Slept. Pain controlled. Ambulating. No flatus yet. Tolerating clears. No n/v.   Objective:  Vital signs in last 24 hours: Temp:  [97.6 F (36.4 C)-98.5 F (36.9 C)] 98.4 F (36.9 C) (03/13 0528) Pulse Rate:  [69-93] 71 (03/13 0528) Resp:  [11-17] 16 (03/13 0528) BP: (91-171)/(51-96) 91/51 (03/13 0528) SpO2:  [98 %-100 %] 98 % (03/13 0528) Weight:  [84.8 kg (187 lb)] 84.8 kg (187 lb) (03/12 1036)  03/12 0701 - 03/13 0700 In: 3090 [P.O.:240; I.V.:2850] Out: 2610 [Urine:2550; Drains:10; Blood:50]    Physical Exam:  General:  well-developed and well-nourished female in NAD, lying in bed, alert & oriented, pleasant HEENT: [place]/AT, EOMI, sclera anicteric, hearing grossly intact, no nasal discharge, MMM Respiratory: nonlabored respirations, satting well on RA, symmetrical chest rise Cardiovascular: pulse regular rate & rhythm Abdominal: soft, NTTP, nondistended, surgical incisions c/d/i without signs of exudate/erythema GU: Foley draining clear yellow urine, JP with minimal SS drainage Extremities: warm, well-perfused, no c/c/e Neuro: no focal deficits   Data Review: Results for orders placed or performed during the hospital encounter of 09/08/16 (from the past 24 hour(s))  Type and screen [facility]     Status: None   Collection Time: 09/08/16 11:30 AM  Result Value Ref Range    ABO/RH(D) O POS    Antibody Screen NEG    Sample Expiration 09/11/2016   ABO/Rh     Status: None   Collection Time: 09/08/16 11:30 AM  Result Value Ref Range   ABO/RH(D) O POS   Basic metabolic panel     Status: Abnormal   Collection Time: 09/08/16  6:25 PM  Result Value Ref Range   Sodium 139 135 - 145 mmol/L   Potassium 3.5 3.5 - 5.1 mmol/L   Chloride 103 101 - 111 mmol/L   CO2 29 22 - 32 mmol/L   Glucose, Bld 167 (H) 65 - 99 mg/dL   BUN 12 6 - 20 mg/dL   Creatinine, Ser 1.61 0.44 - 1.00 mg/dL   Calcium 9.0 8.9 - 09.6 mg/dL   GFR calc non Af Amer >60 >60 mL/min   GFR calc Af Amer >60 >60 mL/min   Anion gap 7 5 - 15  Hemoglobin and hematocrit, blood     Status: None   Collection Time: 09/08/16  6:25 PM  Result Value Ref Range   Hemoglobin 13.2 12.0 - 15.0 g/dL   HCT 04.5 40.9 - 81.1 %  Basic metabolic panel     Status: Abnormal   Collection Time: 09/09/16  6:11 AM  Result Value Ref Range   Sodium 137 135 - 145 mmol/L   Potassium 4.3 3.5 - 5.1 mmol/L   Chloride 106 101 - 111 mmol/L   CO2 25 22 - 32 mmol/L   Glucose, Bld 137 (H) 65 - 99 mg/dL   BUN 9 6 - 20 mg/dL   Creatinine, Ser 9.14 0.44 - 1.00 mg/dL  Calcium 8.6 (L) 8.9 - 10.3 mg/dL   GFR calc non Af Amer >60 >60 mL/min   GFR calc Af Amer >60 >60 mL/min   Anion gap 6 5 - 15  Hemoglobin and hematocrit, blood     Status: Abnormal   Collection Time: 09/09/16  6:11 AM  Result Value Ref Range   Hemoglobin 11.6 (L) 12.0 - 15.0 g/dL   HCT 81.135.7 (L) 91.436.0 - 78.246.0 %    Imaging: None

## 2016-09-09 NOTE — Progress Notes (Signed)
D/c to home via w/c w/ husband voices no c/o

## 2017-01-05 ENCOUNTER — Other Ambulatory Visit (HOSPITAL_COMMUNITY): Payer: Self-pay | Admitting: Urology

## 2017-01-05 DIAGNOSIS — N131 Hydronephrosis with ureteral stricture, not elsewhere classified: Secondary | ICD-10-CM

## 2017-01-08 NOTE — Addendum Note (Signed)
Addendum  created 01/08/17 1640 by Ajay Strubel Ray, MD   Sign clinical note    

## 2017-01-08 NOTE — Anesthesia Postprocedure Evaluation (Signed)
Anesthesia Post Note  Patient: Tracey Sparks  Procedure(s) Performed: Procedure(s) (LRB): CYSTOSCOPY WITH RETROGRADE PYELOGRAM/URETERAL STENT PLACEMENT (Right) ROBOTIC ASSISTED LAPAROSCOPIC PYELOPLASTY (Right)     Anesthesia Post Evaluation  Last Vitals:  Vitals:   09/09/16 0528 09/09/16 1500  BP: (!) 91/51 123/71  Pulse: 71 88  Resp: 16 18  Temp: 36.9 C 36.8 C    Last Pain:  Vitals:   09/09/16 1500  TempSrc: Oral  PainSc:                  Tracey Sparks

## 2017-02-13 ENCOUNTER — Ambulatory Visit (HOSPITAL_COMMUNITY)
Admission: RE | Admit: 2017-02-13 | Discharge: 2017-02-13 | Disposition: A | Discharge status: Home / Self Care | Payer: BC Managed Care – PPO | Source: Ambulatory Visit | Attending: Urology | Admitting: Urology

## 2017-02-13 DIAGNOSIS — R937 Abnormal findings on diagnostic imaging of other parts of musculoskeletal system: Secondary | ICD-10-CM | POA: Diagnosis not present

## 2017-02-13 DIAGNOSIS — N131 Hydronephrosis with ureteral stricture, not elsewhere classified: Secondary | ICD-10-CM | POA: Insufficient documentation

## 2017-02-13 MED ORDER — FUROSEMIDE 10 MG/ML IJ SOLN
INTRAMUSCULAR | Status: AC
  Filled 2017-02-13: qty 8

## 2017-02-13 MED ORDER — TECHNETIUM TC 99M MERTIATIDE
4.9000 | Freq: Once | INTRAVENOUS | Status: AC | PRN
  Administered 2017-02-13: 4.9 via INTRAVENOUS

## 2017-02-13 MED ORDER — FUROSEMIDE 10 MG/ML IJ SOLN
43.0000 mg | Freq: Once | INTRAMUSCULAR | Status: AC
  Administered 2017-02-13: 42 mg via INTRAVENOUS

## 2017-06-09 ENCOUNTER — Other Ambulatory Visit (HOSPITAL_COMMUNITY): Payer: Self-pay | Admitting: Family Medicine

## 2017-06-09 DIAGNOSIS — Z1231 Encounter for screening mammogram for malignant neoplasm of breast: Secondary | ICD-10-CM

## 2017-06-15 ENCOUNTER — Ambulatory Visit (HOSPITAL_COMMUNITY): Payer: BC Managed Care – PPO

## 2017-06-24 ENCOUNTER — Ambulatory Visit (HOSPITAL_COMMUNITY)
Admission: RE | Admit: 2017-06-24 | Discharge: 2017-06-24 | Disposition: A | Discharge status: Home / Self Care | Payer: BC Managed Care – PPO | Source: Ambulatory Visit | Attending: Family Medicine | Admitting: Family Medicine

## 2017-06-24 DIAGNOSIS — Z1231 Encounter for screening mammogram for malignant neoplasm of breast: Secondary | ICD-10-CM | POA: Insufficient documentation

## 2017-06-25 ENCOUNTER — Ambulatory Visit (HOSPITAL_COMMUNITY): Payer: BC Managed Care – PPO

## 2018-03-09 ENCOUNTER — Other Ambulatory Visit (HOSPITAL_COMMUNITY): Payer: Self-pay | Admitting: Urology

## 2018-03-09 DIAGNOSIS — N131 Hydronephrosis with ureteral stricture, not elsewhere classified: Secondary | ICD-10-CM

## 2018-04-27 ENCOUNTER — Ambulatory Visit (HOSPITAL_COMMUNITY): Admission: RE | Admit: 2018-04-27 | Payer: BC Managed Care – PPO | Source: Ambulatory Visit

## 2018-06-16 ENCOUNTER — Other Ambulatory Visit (HOSPITAL_COMMUNITY): Payer: Self-pay | Admitting: Family Medicine

## 2018-06-16 DIAGNOSIS — Z1231 Encounter for screening mammogram for malignant neoplasm of breast: Secondary | ICD-10-CM

## 2018-06-28 ENCOUNTER — Ambulatory Visit (HOSPITAL_COMMUNITY): Payer: BC Managed Care – PPO

## 2018-07-05 ENCOUNTER — Ambulatory Visit (HOSPITAL_COMMUNITY)
Admission: RE | Admit: 2018-07-05 | Discharge: 2018-07-05 | Disposition: A | Discharge status: Home / Self Care | Payer: BC Managed Care – PPO | Source: Ambulatory Visit | Attending: Family Medicine | Admitting: Family Medicine

## 2018-07-05 DIAGNOSIS — Z1231 Encounter for screening mammogram for malignant neoplasm of breast: Secondary | ICD-10-CM | POA: Diagnosis not present

## 2018-08-10 ENCOUNTER — Other Ambulatory Visit: Payer: Self-pay | Admitting: Urology

## 2018-08-10 ENCOUNTER — Other Ambulatory Visit (HOSPITAL_COMMUNITY): Payer: Self-pay | Admitting: Urology

## 2018-08-10 DIAGNOSIS — Q6239 Other obstructive defects of renal pelvis and ureter: Secondary | ICD-10-CM

## 2018-08-10 DIAGNOSIS — Q6211 Congenital occlusion of ureteropelvic junction: Principal | ICD-10-CM

## 2018-08-27 ENCOUNTER — Ambulatory Visit (HOSPITAL_COMMUNITY)
Admission: RE | Admit: 2018-08-27 | Discharge: 2018-08-27 | Disposition: A | Discharge status: Home / Self Care | Payer: BC Managed Care – PPO | Source: Ambulatory Visit | Attending: Urology | Admitting: Urology

## 2018-08-27 DIAGNOSIS — Q6211 Congenital occlusion of ureteropelvic junction: Secondary | ICD-10-CM | POA: Insufficient documentation

## 2018-08-27 DIAGNOSIS — Q6239 Other obstructive defects of renal pelvis and ureter: Secondary | ICD-10-CM

## 2018-08-27 MED ORDER — FUROSEMIDE 10 MG/ML IJ SOLN
40.0000 mg | Freq: Once | INTRAMUSCULAR | Status: AC
  Administered 2018-08-27: 42 mg via INTRAVENOUS

## 2018-08-27 MED ORDER — FUROSEMIDE 10 MG/ML IJ SOLN
INTRAMUSCULAR | Status: AC
  Filled 2018-08-27: qty 8

## 2018-08-27 MED ORDER — TECHNETIUM TC 99M MERTIATIDE
5.2000 | Freq: Once | INTRAVENOUS | Status: AC | PRN
  Administered 2018-08-27: 5.2 via INTRAVENOUS

## 2019-07-13 ENCOUNTER — Other Ambulatory Visit (HOSPITAL_COMMUNITY): Payer: Self-pay | Admitting: Family Medicine

## 2019-07-13 DIAGNOSIS — Z1231 Encounter for screening mammogram for malignant neoplasm of breast: Secondary | ICD-10-CM

## 2019-07-21 ENCOUNTER — Ambulatory Visit (HOSPITAL_COMMUNITY)
Admission: RE | Admit: 2019-07-21 | Discharge: 2019-07-21 | Disposition: A | Discharge status: Home / Self Care | Payer: BC Managed Care – PPO | Source: Ambulatory Visit | Attending: Family Medicine | Admitting: Family Medicine

## 2019-07-21 ENCOUNTER — Other Ambulatory Visit: Payer: Self-pay

## 2019-07-21 DIAGNOSIS — Z1231 Encounter for screening mammogram for malignant neoplasm of breast: Secondary | ICD-10-CM | POA: Diagnosis not present

## 2019-07-21 IMAGING — MG DIGITAL SCREENING BILAT W/ TOMO W/ CAD
8 series · 9 of 24 positions shown · non-contrast
Comparison: Previous exam(s).

ACR Breast Density Category a: The breast tissue is almost entirely
fatty.

CLINICAL DATA: Screening.

EXAM:
DIGITAL SCREENING BILATERAL MAMMOGRAM WITH TOMO AND CAD

[L CC synth-2D]
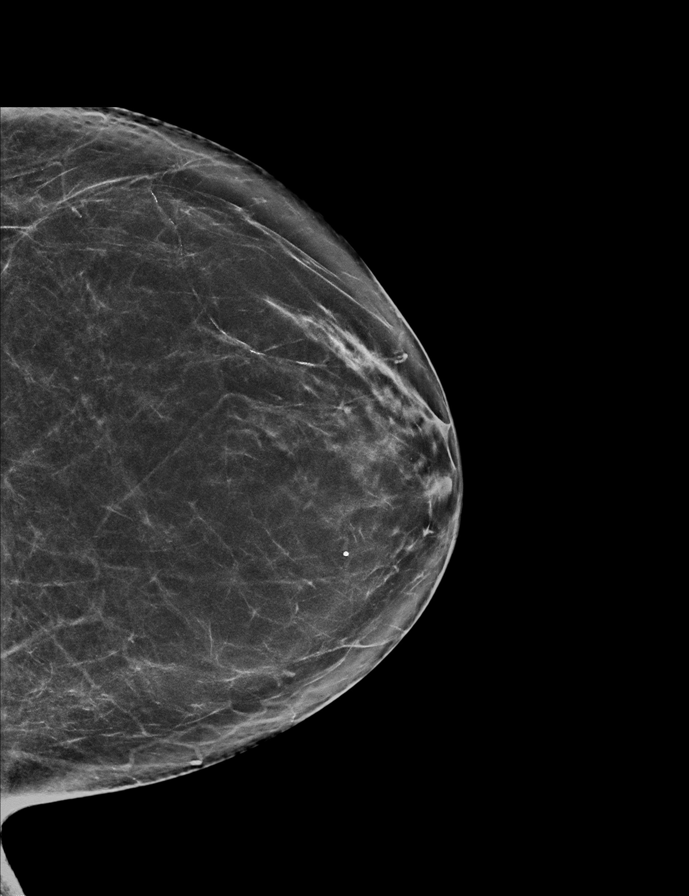

[R CC synth-2D]
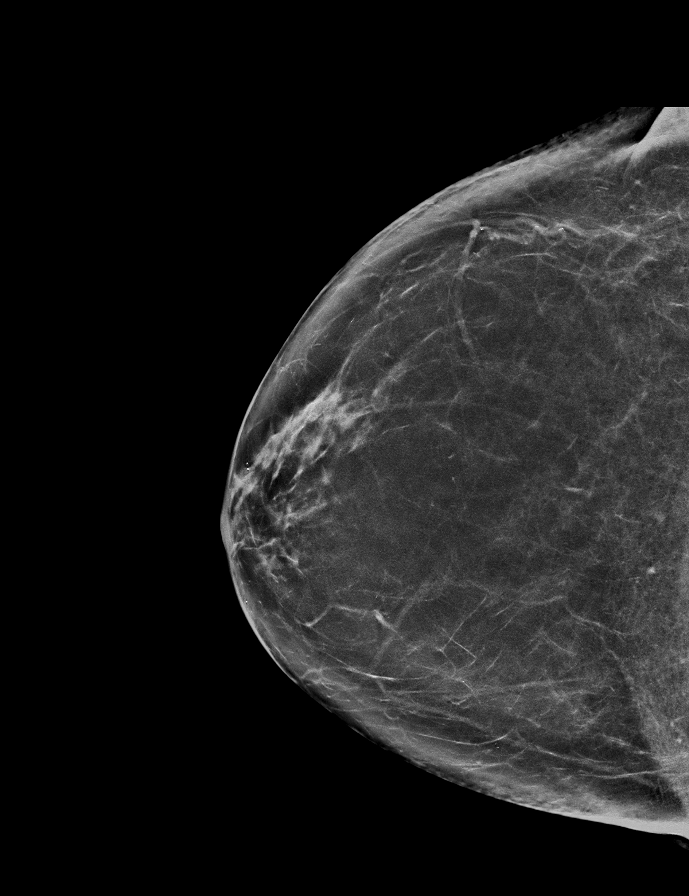

[L MLO synth-2D]
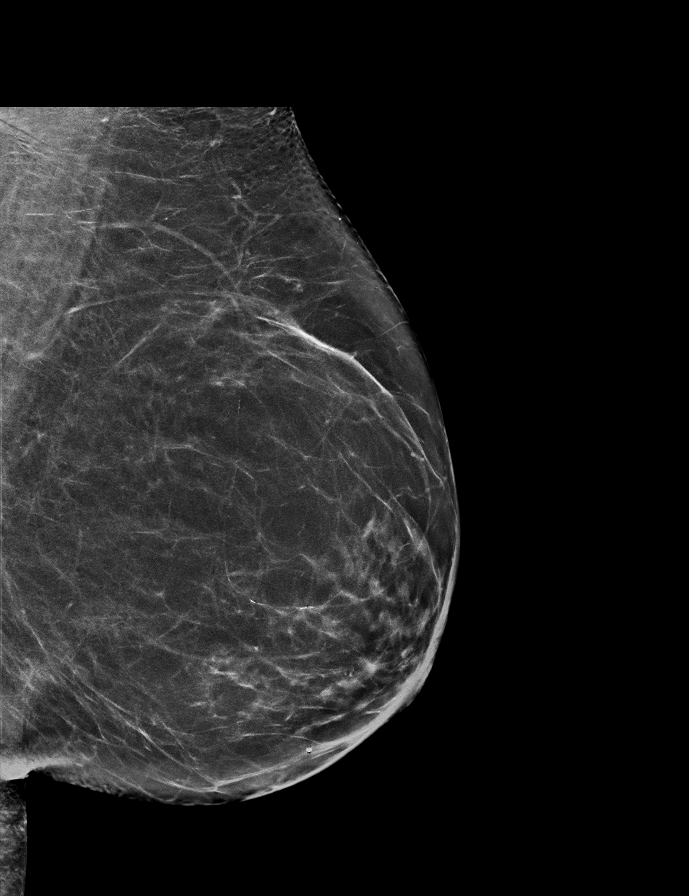

[R MLO synth-2D]
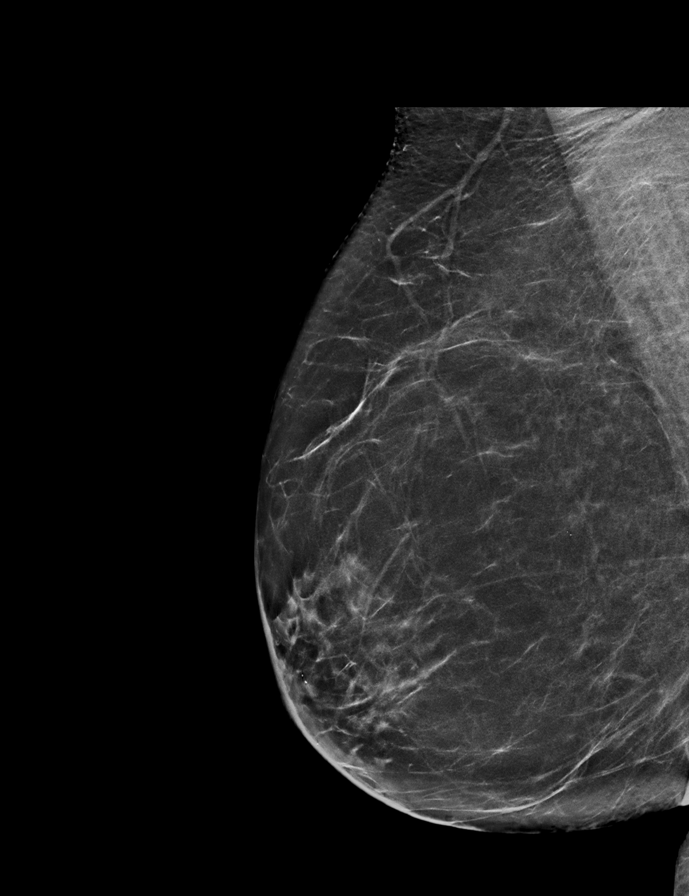

[L CC tomo · 2 of 65 frames shown]
[frame 21/65]
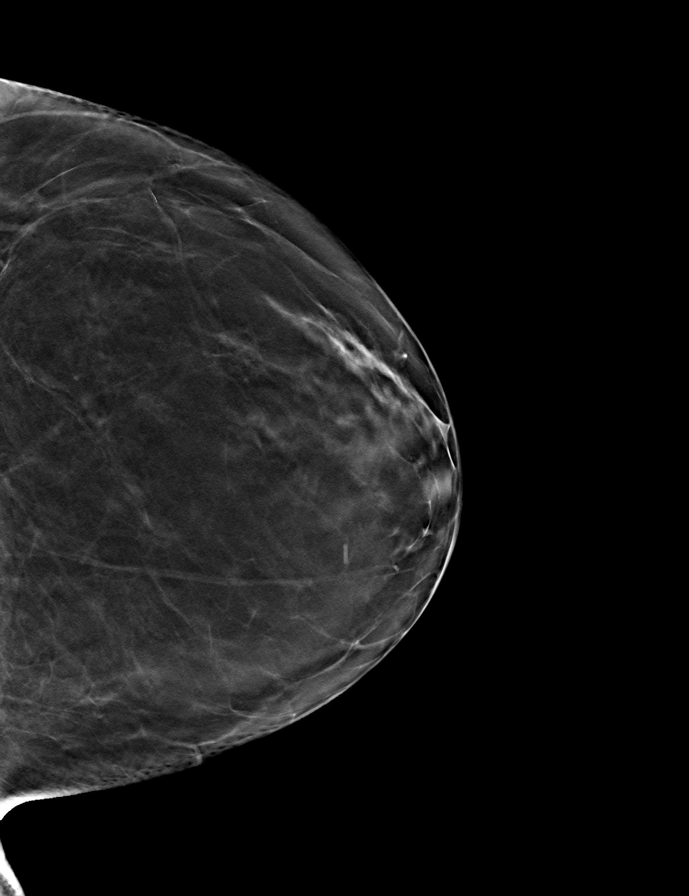
[frame 33/65]
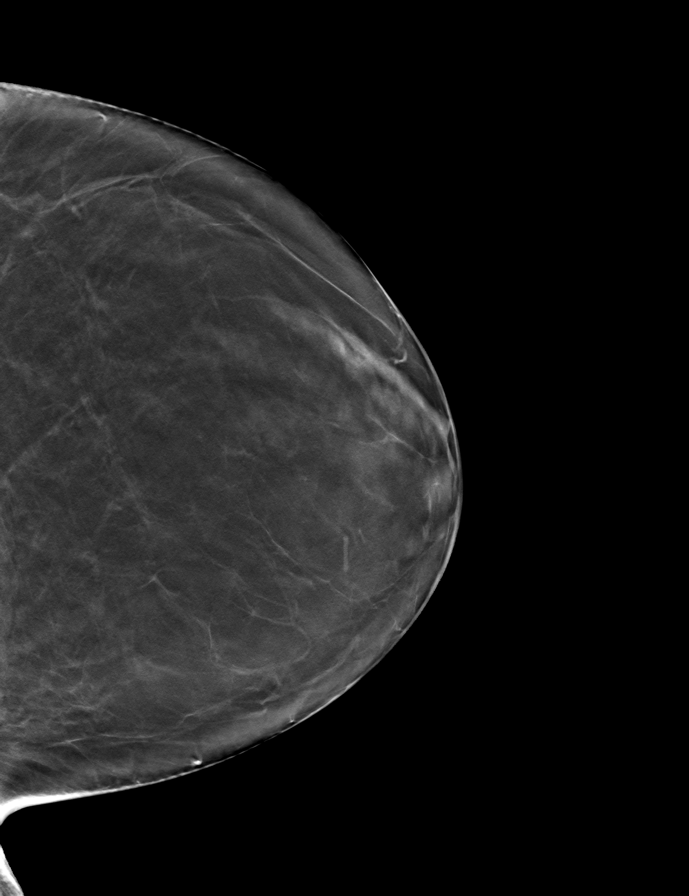

[L MLO tomo · tomo slice 35/68.0]
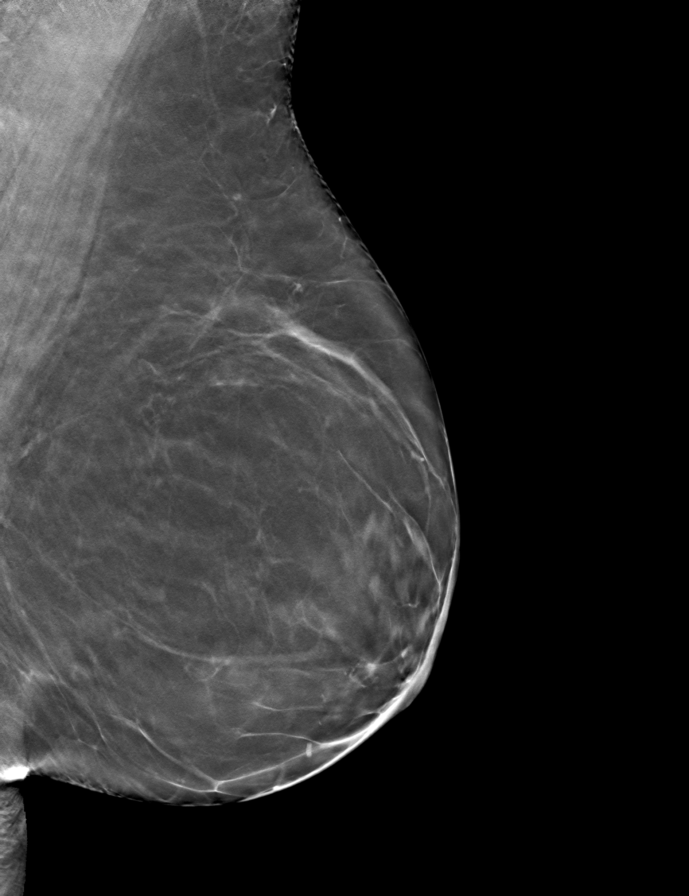

[R CC tomo · tomo slice 35/70.0]
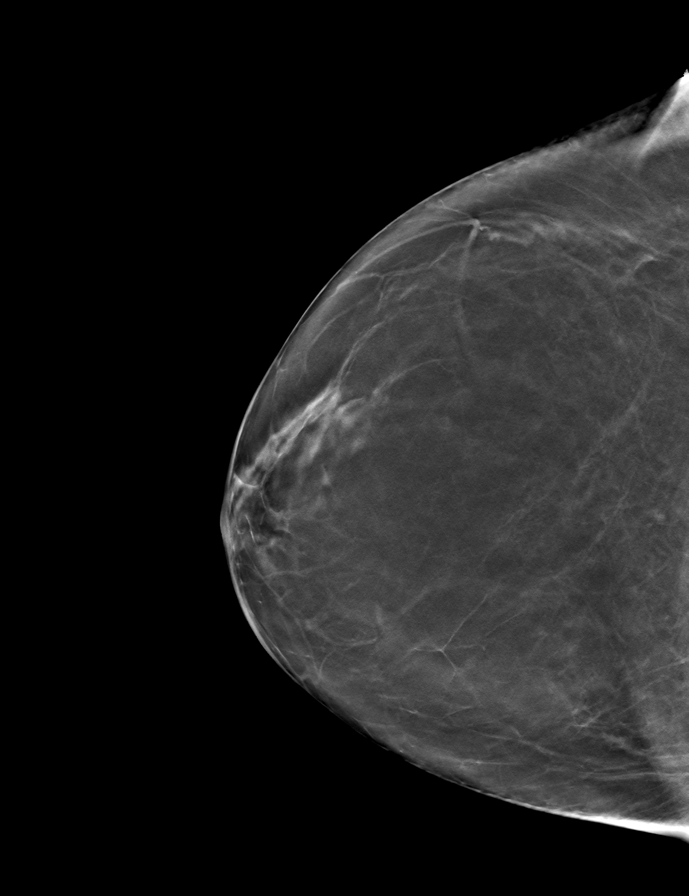

[R MLO tomo · tomo slice 35/68.0]
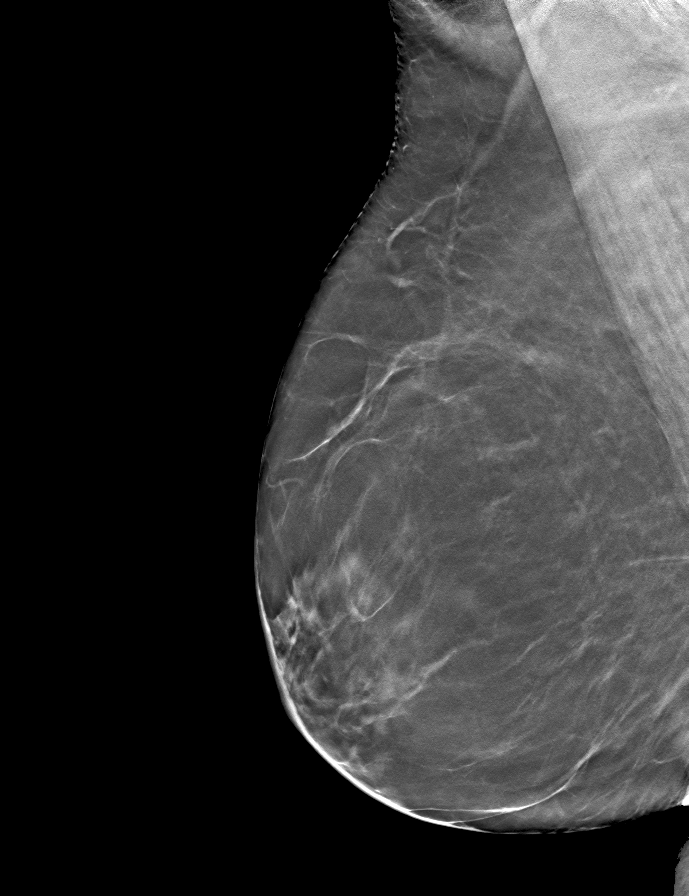

[9 of 24 positions shown; findings below may reference images not displayed]

FINDINGS: There are no findings suspicious for malignancy. Images were
processed with CAD.
IMPRESSION: No mammographic evidence of malignancy. A result letter of this
screening mammogram will be mailed directly to the patient.

RECOMMENDATION:
Screening mammogram in one year. (Code:[TA])

BI-RADS CATEGORY  1: Negative.

## 2019-08-24 ENCOUNTER — Other Ambulatory Visit (HOSPITAL_COMMUNITY): Payer: Self-pay | Admitting: Urology

## 2019-08-24 ENCOUNTER — Other Ambulatory Visit: Payer: Self-pay | Admitting: Urology

## 2019-08-24 DIAGNOSIS — N131 Hydronephrosis with ureteral stricture, not elsewhere classified: Secondary | ICD-10-CM

## 2019-08-28 ENCOUNTER — Ambulatory Visit: Payer: BC Managed Care – PPO

## 2019-08-28 ENCOUNTER — Ambulatory Visit: Payer: BC Managed Care – PPO | Attending: Internal Medicine

## 2019-08-28 DIAGNOSIS — Z23 Encounter for immunization: Secondary | ICD-10-CM

## 2019-08-28 NOTE — Progress Notes (Signed)
   Covid-19 Vaccination Clinic  Name:  Tracey Sparks    MRN: [id number] DOB: [birth year]  08/28/2019  [name] was observed post Covid-19 immunization for 15 minutes without incidence. She was provided with Vaccine Information Sheet and instruction to access the V-Safe system.   [name] was instructed to call 911 with any severe reactions post vaccine: Marland Kitchen Difficulty breathing  . Swelling of your face and throat  . A fast heartbeat  . A bad rash all over your body  . Dizziness and weakness    Immunizations Administered    Name Date Dose VIS Date Route   Moderna COVID-19 Vaccine 08/28/2019 10:15 AM 0.5 mL 05/31/2019 Intramuscular   Manufacturer: Moderna   Lot: [id number]   NDC: 43606-770-34

## 2019-09-06 ENCOUNTER — Ambulatory Visit (HOSPITAL_COMMUNITY): Payer: BC Managed Care – PPO

## 2019-10-01 ENCOUNTER — Ambulatory Visit: Payer: BC Managed Care – PPO | Attending: Internal Medicine

## 2019-10-01 ENCOUNTER — Other Ambulatory Visit: Payer: Self-pay

## 2019-10-01 DIAGNOSIS — Z23 Encounter for immunization: Secondary | ICD-10-CM

## 2019-10-01 NOTE — Progress Notes (Signed)
   Covid-19 Vaccination Clinic  Name:  Tracey Sparks    MRN: [id number] DOB: [birth year]  10/01/2019  [name] was observed post Covid-19 immunization for 15 minutes without incident. She was provided with Vaccine Information Sheet and instruction to access the V-Safe system.   [name] was instructed to call 911 with any severe reactions post vaccine: Marland Kitchen Difficulty breathing  . Swelling of face and throat  . A fast heartbeat  . A bad rash all over body  . Dizziness and weakness   Immunizations Administered    Name Date Dose VIS Date Route   Moderna COVID-19 Vaccine 10/01/2019  8:56 AM 0.5 mL 05/31/2019 Intramuscular   Manufacturer: Moderna   Lot: [id number]   NDC: 72158-727-61

## 2020-07-26 ENCOUNTER — Other Ambulatory Visit (HOSPITAL_COMMUNITY): Payer: Self-pay | Admitting: Family Medicine

## 2020-07-26 DIAGNOSIS — Z1231 Encounter for screening mammogram for malignant neoplasm of breast: Secondary | ICD-10-CM

## 2020-08-01 ENCOUNTER — Encounter (HOSPITAL_COMMUNITY): Payer: Self-pay

## 2020-08-01 ENCOUNTER — Other Ambulatory Visit: Payer: Self-pay

## 2020-08-01 ENCOUNTER — Ambulatory Visit (HOSPITAL_COMMUNITY)
Admission: RE | Admit: 2020-08-01 | Discharge: 2020-08-01 | Disposition: A | Discharge status: Home / Self Care | Payer: BC Managed Care – PPO | Source: Ambulatory Visit | Attending: Family Medicine | Admitting: Family Medicine

## 2020-08-01 DIAGNOSIS — Z1231 Encounter for screening mammogram for malignant neoplasm of breast: Secondary | ICD-10-CM | POA: Diagnosis present

## 2021-06-28 ENCOUNTER — Other Ambulatory Visit (HOSPITAL_COMMUNITY): Payer: Self-pay | Admitting: Family Medicine

## 2021-06-28 DIAGNOSIS — Z1231 Encounter for screening mammogram for malignant neoplasm of breast: Secondary | ICD-10-CM

## 2021-08-05 ENCOUNTER — Other Ambulatory Visit: Payer: Self-pay

## 2021-08-05 ENCOUNTER — Ambulatory Visit (HOSPITAL_COMMUNITY)
Admission: RE | Admit: 2021-08-05 | Discharge: 2021-08-05 | Disposition: A | Discharge status: Home / Self Care | Payer: BC Managed Care – PPO | Source: Ambulatory Visit | Attending: Family Medicine | Admitting: Family Medicine

## 2021-08-05 DIAGNOSIS — Z1231 Encounter for screening mammogram for malignant neoplasm of breast: Secondary | ICD-10-CM | POA: Insufficient documentation

## 2021-09-16 ENCOUNTER — Other Ambulatory Visit (HOSPITAL_COMMUNITY): Payer: Self-pay | Admitting: Urology

## 2021-09-16 ENCOUNTER — Other Ambulatory Visit: Payer: Self-pay | Admitting: Urology

## 2021-09-16 DIAGNOSIS — N131 Hydronephrosis with ureteral stricture, not elsewhere classified: Secondary | ICD-10-CM

## 2021-11-15 ENCOUNTER — Encounter (HOSPITAL_COMMUNITY)
Admission: RE | Admit: 2021-11-15 | Discharge: 2021-11-15 | Disposition: A | Discharge status: Home / Self Care | Payer: BC Managed Care – PPO | Source: Ambulatory Visit | Attending: Urology | Admitting: Urology

## 2021-11-15 DIAGNOSIS — N131 Hydronephrosis with ureteral stricture, not elsewhere classified: Secondary | ICD-10-CM | POA: Diagnosis present

## 2021-11-15 DIAGNOSIS — Z1231 Encounter for screening mammogram for malignant neoplasm of breast: Secondary | ICD-10-CM | POA: Insufficient documentation

## 2021-11-15 MED ORDER — TECHNETIUM TC 99M MERTIATIDE
5.0000 | Freq: Once | INTRAVENOUS | Status: AC | PRN
  Administered 2021-11-15: 5.15 via INTRAVENOUS

## 2021-11-15 MED ORDER — FUROSEMIDE 10 MG/ML IJ SOLN
INTRAMUSCULAR | Status: AC
  Administered 2021-11-15: 40 mg
  Filled 2021-11-15: qty 2

## 2021-11-18 ENCOUNTER — Other Ambulatory Visit (HOSPITAL_COMMUNITY): Payer: BC Managed Care – PPO

## 2021-11-19 ENCOUNTER — Other Ambulatory Visit (HOSPITAL_COMMUNITY): Payer: BC Managed Care – PPO

## 2022-05-02 DIAGNOSIS — R5383 Other fatigue: Secondary | ICD-10-CM | POA: Diagnosis not present

## 2022-05-02 DIAGNOSIS — Z0000 Encounter for general adult medical examination without abnormal findings: Secondary | ICD-10-CM | POA: Diagnosis not present

## 2022-05-02 DIAGNOSIS — Z1329 Encounter for screening for other suspected endocrine disorder: Secondary | ICD-10-CM | POA: Diagnosis not present

## 2022-05-02 DIAGNOSIS — Z13220 Encounter for screening for lipoid disorders: Secondary | ICD-10-CM | POA: Diagnosis not present

## 2022-05-13 DIAGNOSIS — Z6829 Body mass index (BMI) 29.0-29.9, adult: Secondary | ICD-10-CM | POA: Diagnosis not present

## 2022-05-13 DIAGNOSIS — Z23 Encounter for immunization: Secondary | ICD-10-CM | POA: Diagnosis not present

## 2022-05-13 DIAGNOSIS — Z0000 Encounter for general adult medical examination without abnormal findings: Secondary | ICD-10-CM | POA: Diagnosis not present

## 2022-05-13 DIAGNOSIS — R030 Elevated blood-pressure reading, without diagnosis of hypertension: Secondary | ICD-10-CM | POA: Diagnosis not present

## 2022-06-11 DIAGNOSIS — M810 Age-related osteoporosis without current pathological fracture: Secondary | ICD-10-CM | POA: Diagnosis not present

## 2022-07-09 ENCOUNTER — Other Ambulatory Visit (HOSPITAL_COMMUNITY): Payer: Self-pay | Admitting: Family Medicine

## 2022-07-09 DIAGNOSIS — Z1231 Encounter for screening mammogram for malignant neoplasm of breast: Secondary | ICD-10-CM

## 2022-07-11 DIAGNOSIS — Z9889 Other specified postprocedural states: Secondary | ICD-10-CM | POA: Diagnosis not present

## 2022-07-11 DIAGNOSIS — H524 Presbyopia: Secondary | ICD-10-CM | POA: Diagnosis not present

## 2022-07-11 DIAGNOSIS — H2513 Age-related nuclear cataract, bilateral: Secondary | ICD-10-CM | POA: Diagnosis not present

## 2022-08-08 ENCOUNTER — Ambulatory Visit (HOSPITAL_COMMUNITY)
Admission: RE | Admit: 2022-08-08 | Discharge: 2022-08-08 | Disposition: A | Discharge status: Home / Self Care | Payer: Medicare Other | Source: Ambulatory Visit | Attending: Family Medicine | Admitting: Family Medicine

## 2022-08-08 DIAGNOSIS — Z1231 Encounter for screening mammogram for malignant neoplasm of breast: Secondary | ICD-10-CM | POA: Diagnosis not present

## 2022-09-18 DIAGNOSIS — K080 Exfoliation of teeth due to systemic causes: Secondary | ICD-10-CM | POA: Diagnosis not present

## 2022-11-12 DIAGNOSIS — L821 Other seborrheic keratosis: Secondary | ICD-10-CM | POA: Diagnosis not present

## 2022-11-12 DIAGNOSIS — X32XXXD Exposure to sunlight, subsequent encounter: Secondary | ICD-10-CM | POA: Diagnosis not present

## 2022-11-12 DIAGNOSIS — L570 Actinic keratosis: Secondary | ICD-10-CM | POA: Diagnosis not present

## 2022-11-12 DIAGNOSIS — D225 Melanocytic nevi of trunk: Secondary | ICD-10-CM | POA: Diagnosis not present

## 2022-11-12 DIAGNOSIS — T148XXA Other injury of unspecified body region, initial encounter: Secondary | ICD-10-CM | POA: Diagnosis not present

## 2023-01-23 DIAGNOSIS — R300 Dysuria: Secondary | ICD-10-CM | POA: Diagnosis not present

## 2023-01-23 DIAGNOSIS — Z6829 Body mass index (BMI) 29.0-29.9, adult: Secondary | ICD-10-CM | POA: Diagnosis not present

## 2023-02-03 DIAGNOSIS — Z23 Encounter for immunization: Secondary | ICD-10-CM | POA: Diagnosis not present

## 2023-02-03 DIAGNOSIS — T148XXA Other injury of unspecified body region, initial encounter: Secondary | ICD-10-CM | POA: Diagnosis not present

## 2023-02-03 DIAGNOSIS — Z6828 Body mass index (BMI) 28.0-28.9, adult: Secondary | ICD-10-CM | POA: Diagnosis not present

## 2023-02-03 DIAGNOSIS — M79673 Pain in unspecified foot: Secondary | ICD-10-CM | POA: Diagnosis not present

## 2023-02-10 DIAGNOSIS — M25579 Pain in unspecified ankle and joints of unspecified foot: Secondary | ICD-10-CM | POA: Diagnosis not present

## 2023-02-10 DIAGNOSIS — M79672 Pain in left foot: Secondary | ICD-10-CM | POA: Diagnosis not present

## 2023-02-10 DIAGNOSIS — M76822 Posterior tibial tendinitis, left leg: Secondary | ICD-10-CM | POA: Diagnosis not present

## 2023-02-27 DIAGNOSIS — M62830 Muscle spasm of back: Secondary | ICD-10-CM | POA: Diagnosis not present

## 2023-02-27 DIAGNOSIS — M546 Pain in thoracic spine: Secondary | ICD-10-CM | POA: Diagnosis not present

## 2023-02-27 DIAGNOSIS — M9903 Segmental and somatic dysfunction of lumbar region: Secondary | ICD-10-CM | POA: Diagnosis not present

## 2023-02-27 DIAGNOSIS — M9902 Segmental and somatic dysfunction of thoracic region: Secondary | ICD-10-CM | POA: Diagnosis not present

## 2023-03-03 DIAGNOSIS — M79672 Pain in left foot: Secondary | ICD-10-CM | POA: Diagnosis not present

## 2023-03-03 DIAGNOSIS — M76822 Posterior tibial tendinitis, left leg: Secondary | ICD-10-CM | POA: Diagnosis not present

## 2023-03-03 DIAGNOSIS — M9903 Segmental and somatic dysfunction of lumbar region: Secondary | ICD-10-CM | POA: Diagnosis not present

## 2023-03-03 DIAGNOSIS — M546 Pain in thoracic spine: Secondary | ICD-10-CM | POA: Diagnosis not present

## 2023-03-03 DIAGNOSIS — M9902 Segmental and somatic dysfunction of thoracic region: Secondary | ICD-10-CM | POA: Diagnosis not present

## 2023-03-03 DIAGNOSIS — M62830 Muscle spasm of back: Secondary | ICD-10-CM | POA: Diagnosis not present

## 2023-03-04 DIAGNOSIS — Z86010 Personal history of colonic polyps: Secondary | ICD-10-CM | POA: Diagnosis not present

## 2023-03-04 DIAGNOSIS — D122 Benign neoplasm of ascending colon: Secondary | ICD-10-CM | POA: Diagnosis not present

## 2023-03-04 DIAGNOSIS — K648 Other hemorrhoids: Secondary | ICD-10-CM | POA: Diagnosis not present

## 2023-03-04 DIAGNOSIS — Z09 Encounter for follow-up examination after completed treatment for conditions other than malignant neoplasm: Secondary | ICD-10-CM | POA: Diagnosis not present

## 2023-03-06 DIAGNOSIS — M546 Pain in thoracic spine: Secondary | ICD-10-CM | POA: Diagnosis not present

## 2023-03-06 DIAGNOSIS — M9902 Segmental and somatic dysfunction of thoracic region: Secondary | ICD-10-CM | POA: Diagnosis not present

## 2023-03-06 DIAGNOSIS — M62830 Muscle spasm of back: Secondary | ICD-10-CM | POA: Diagnosis not present

## 2023-03-06 DIAGNOSIS — M9903 Segmental and somatic dysfunction of lumbar region: Secondary | ICD-10-CM | POA: Diagnosis not present

## 2023-03-11 DIAGNOSIS — M9902 Segmental and somatic dysfunction of thoracic region: Secondary | ICD-10-CM | POA: Diagnosis not present

## 2023-03-11 DIAGNOSIS — M62830 Muscle spasm of back: Secondary | ICD-10-CM | POA: Diagnosis not present

## 2023-03-11 DIAGNOSIS — M546 Pain in thoracic spine: Secondary | ICD-10-CM | POA: Diagnosis not present

## 2023-03-11 DIAGNOSIS — M9903 Segmental and somatic dysfunction of lumbar region: Secondary | ICD-10-CM | POA: Diagnosis not present

## 2023-03-30 DIAGNOSIS — S823 Fracture of lower end of tibia: Secondary | ICD-10-CM | POA: Diagnosis not present

## 2023-04-02 ENCOUNTER — Other Ambulatory Visit: Payer: Self-pay | Admitting: Orthopaedic Surgery

## 2023-04-02 DIAGNOSIS — M25571 Pain in right ankle and joints of right foot: Secondary | ICD-10-CM

## 2023-04-02 DIAGNOSIS — S82842A Displaced bimalleolar fracture of left lower leg, initial encounter for closed fracture: Secondary | ICD-10-CM | POA: Diagnosis not present

## 2023-04-02 DIAGNOSIS — S82841A Displaced bimalleolar fracture of right lower leg, initial encounter for closed fracture: Secondary | ICD-10-CM | POA: Diagnosis not present

## 2023-04-03 ENCOUNTER — Other Ambulatory Visit: Payer: Self-pay

## 2023-04-03 ENCOUNTER — Other Ambulatory Visit: Payer: Self-pay | Admitting: Orthopaedic Surgery

## 2023-04-03 ENCOUNTER — Ambulatory Visit
Admission: RE | Admit: 2023-04-03 | Discharge: 2023-04-03 | Disposition: A | Discharge status: Home / Self Care | Payer: BC Managed Care – PPO | Source: Ambulatory Visit | Attending: Orthopaedic Surgery | Admitting: Orthopaedic Surgery

## 2023-04-03 ENCOUNTER — Ambulatory Visit
Admission: RE | Admit: 2023-04-03 | Discharge: 2023-04-03 | Disposition: A | Discharge status: Home / Self Care | Payer: BC Managed Care – PPO | Source: Ambulatory Visit | Attending: Orthopaedic Surgery

## 2023-04-03 DIAGNOSIS — M25571 Pain in right ankle and joints of right foot: Secondary | ICD-10-CM

## 2023-04-03 NOTE — Progress Notes (Signed)
PCP - Sasser, Clarene Critchley, MD Cardiologist -   PPM/ICD - denies Device Orders - n/a Rep Notified - n/a  Chest x-ray - denies EKG - denies Stress Test - denies ECHO - denies Cardiac Cath - denies  CPAP - denies  DM denies  Blood Thinner Instructions: denies Aspirin Instructions: Yes per patient was told to continue  ERAS Protcol - NPO per patient  COVID TEST-   Anesthesia review: yes recent fall   Patient verbally denies any shortness of breath, fever, cough and chest pain during phone call   -------------  SDW INSTRUCTIONS given:  Your procedure is scheduled on April 05, 2023.  Report to [facility] Main Entrance "A" at 6:45 A.M., and check in at the Admitting office.  Call this number if you have problems the morning of surgery:  [phone]   Remember:  Do not eat  or drink after midnight the night before your surgery (NPO per  patient told to by surgeon)     Take these medicines the morning of surgery with A SIP OF WATER   IF NEEDED acetaminophen (TYLENOL)   As of today, STOP taking any Aspirin (unless otherwise instructed by your surgeon) Aleve, Naproxen, Ibuprofen, Motrin, Advil, Goody's, BC's, all herbal medications, fish oil, and all vitamins.                      Do not wear jewelry, make up, or nail polish            Do not wear lotions, powders, perfumes/colognes, or deodorant.            Do not shave 48 hours prior to surgery.  Men may shave face and neck.            Do not bring valuables to the hospital.            [facility] is not responsible for any belongings or valuables.  Do NOT Smoke (Tobacco/Vaping) 24 hours prior to your procedure If you use a CPAP at night, you may bring all equipment for your overnight stay.   Contacts, glasses, dentures or bridgework may not be worn into surgery.      For patients admitted to the hospital, discharge time will be determined by your treatment team.   Patients discharged the day of surgery will not be  allowed to drive home, and someone needs to stay with them for 24 hours.    Special instructions:   [place]- Preparing For Surgery  Before surgery, you can play an important role. Because skin is not sterile, your skin needs to be as free of germs as possible. You can reduce the number of germs on your skin by washing with CHG (chlorahexidine gluconate) Soap before surgery.  CHG is an antiseptic cleaner which kills germs and bonds with the skin to continue killing germs even after washing.    Oral Hygiene is also important to reduce your risk of infection.  Remember - BRUSH YOUR TEETH THE MORNING OF SURGERY WITH YOUR REGULAR TOOTHPASTE  Please do not use if you have an allergy to CHG or antibacterial soaps. If your skin becomes reddened/irritated stop using the CHG.  Do not shave (including legs and underarms) for at least 48 hours prior to first CHG shower. It is OK to shave your face.  Please follow these instructions carefully.   Shower the NIGHT BEFORE SURGERY and the MORNING OF SURGERY with DIAL Soap.   Pat yourself dry  with a CLEAN TOWEL.  Wear CLEAN PAJAMAS to bed the night before surgery  Place CLEAN SHEETS on your bed the night of your first shower and DO NOT SLEEP WITH PETS.   Day of Surgery: Please shower morning of surgery  Wear Clean/Comfortable clothing the morning of surgery Do not apply any deodorants/lotions.   Remember to brush your teeth WITH YOUR REGULAR TOOTHPASTE.   Questions were answered. Patient verbalized understanding of instructions.

## 2023-04-03 NOTE — H&P (Signed)
ORTHOPAEDIC SURGERY H&P  Subjective:  The patient presents with bilateral ankle fx.   Past Medical History:  Diagnosis Date   History of kidney stones     Past Surgical History:  Procedure Laterality Date   ABDOMINAL HYSTERECTOMY     CESAREAN SECTION     CYSTOSCOPY W/ RETROGRADES Right 06/17/2016   Procedure: CYSTOSCOPY WITH RETROGRADE PYELOGRAM;  Surgeon: Marcine Matar, MD;  Location: [facility];  Service: Urology;  Laterality: Right;   CYSTOSCOPY W/ URETERAL STENT PLACEMENT Right 09/08/2016   Procedure: CYSTOSCOPY WITH RETROGRADE PYELOGRAM/URETERAL STENT PLACEMENT;  Surgeon: Heloise Purpura, MD;  Location: [facility];  Service: Urology;  Laterality: Right;   CYSTOSCOPY WITH STENT PLACEMENT Right 06/17/2016   Procedure: CYSTOSCOPY WITH STENT PLACEMENT;  Surgeon: Marcine Matar, MD;  Location: [facility];  Service: Urology;  Laterality: Right;   LAPAROSCOPIC BILATERAL SALPINGO OOPHERECTOMY Bilateral    ROBOT ASSISTED PYELOPLASTY Right 09/08/2016   Procedure: ROBOTIC ASSISTED LAPAROSCOPIC PYELOPLASTY;  Surgeon: Heloise Purpura, MD;  Location: [facility];  Service: Urology;  Laterality: Right;     (Not in an outpatient encounter)    No Known Allergies  Social History   Socioeconomic History   Marital status: Married    Spouse name: Not on file   Number of children: Not on file   Years of education: Not on file   Highest education level: Not on file  Occupational History   Not on file  Tobacco Use   Smoking status: Never   Smokeless tobacco: Never  Substance and Sexual Activity   Alcohol use: No   Drug use: No   Sexual activity: Yes    Birth control/protection: Surgical  Other Topics Concern   Not on file  Social History Narrative   Not on file   Social Determinants of Health   Financial Resource Strain: Not on file  Food Insecurity: Not on file  Transportation Needs: Not on file  Physical Activity: Not on file  Stress: Not on file  Social Connections: Unknown (01/30/2023)    [facility]   Social Network    Social Network: Not on file  Intimate Partner Violence: Unknown (01/30/2023)   Received from [facility]   HITS    Physically Hurt: Not on file    Insult or Talk Down To: Not on file    Threaten Physical Harm: Not on file    Scream or Curse: Not on file     History reviewed. No pertinent family history.   Review of Systems Pertinent items are noted in HPI.  Objective: Vital signs in last 24 hours:    02/13/2017    7:00 AM 09/09/2016    3:00 PM 09/09/2016    5:28 AM  Vitals with BMI  Weight 185 lbs    Systolic  123 91  Diastolic  71 51  Pulse  88 71      EXAM: General: Well nourished, well developed. Awake, alert and oriented to time, place, person. Normal mood and affect. No apparent distress. Breathing room air.  Bilateral Lower Extremity: Alignment - Neutral Deformity - None Skin intact Tenderness to palpation - bilateral ankles 5/5 TA, PT, GS, Per, EHL, FHL Sensation intact to light touch throughout Palpable DP and PT pulses Special testing: None   Imaging Review All images taken were independently reviewed by me.  Assessment/Plan: The clinical and radiographic findings were reviewed and discussed at length with the patient.  The patient has bilateral ankle fx.  We spoke at length about  the natural course of these findings. We discussed nonoperative and operative treatment options in detail.  The risks and benefits were presented and reviewed. The risks due to hardware failure/irritation, new/persistent/recurrent infection, stiffness, nerve/vessel/tendon injury, nonunion/malunion of any fracture, wound healing issues, allograft usage, development of arthritis, failure of this surgery, possibility of external fixation in certain situations, possibility of delayed definitive surgery, need for further surgery, prolonged wound care including further soft tissue coverage procedures, thromboembolic events,  anesthesia/medical complications/events perioperatively and beyond, amputation, death among others were discussed. The patient acknowledged the explanation and agreed to proceed with the plan.  Tracey Sparks  Orthopaedic Surgery [facility]

## 2023-04-03 NOTE — Discharge Instructions (Signed)
Tracey Ramanathan, MD [facility]  Please read the following information regarding your care after surgery.  Medications  You only need a prescription for the narcotic pain medicine (ex. oxycodone, Percocet, Norco).  All of the other medicines listed below are available over the counter. ? Aleve 2 pills twice a day for the first 3 days after surgery. ? acetominophen (Tylenol) 650 mg every 4-6 hours as you need for minor to moderate pain ? oxycodone as prescribed for severe pain  ? To help prevent blood clots, take aspirin (81 mg) twice daily for 42 days after surgery (or total duration of nonweightbearing).  You should also get up every hour while you are awake to move around.  Weight Bearing ? Do NOT bear any weight on the operated leg or foot. This means do NOT touch your surgical leg to the ground!  Cast / Splint / Dressing ? If you have a splint, do NOT remove this. Keep your splint, cast or dressing clean and dry.  Don't put anything (coat hanger, pencil, etc) down inside of it.  If it gets wet, call the office immediately to schedule an appointment for a cast change.  Swelling IMPORTANT: It is normal for you to have swelling where you had surgery. To reduce swelling and pain, keep at least 3 pillows under your leg so that your toes are above your nose and your heel is above the level of your hip.  It may be necessary to keep your foot or leg elevated for several weeks.  This is critical to helping your incisions heal and your pain to feel better.  Follow Up Call my office at [phone] when you are discharged from the hospital or surgery center to schedule an appointment to be seen 7-10 days after surgery.  Call my office at [phone] if you develop a fever >101.5 F, nausea, vomiting, bleeding from the surgical site or severe pain.     

## 2023-04-05 ENCOUNTER — Other Ambulatory Visit: Payer: Self-pay

## 2023-04-05 ENCOUNTER — Encounter (HOSPITAL_COMMUNITY): Payer: Self-pay | Admitting: Orthopaedic Surgery

## 2023-04-05 ENCOUNTER — Inpatient Hospital Stay (HOSPITAL_COMMUNITY): Payer: Medicare Other

## 2023-04-05 ENCOUNTER — Inpatient Hospital Stay (HOSPITAL_COMMUNITY): Payer: Self-pay | Admitting: Vascular Surgery

## 2023-04-05 ENCOUNTER — Encounter (HOSPITAL_COMMUNITY)
Admission: RE | Disposition: A | Discharge status: Skilled Nursing Facility | Payer: Self-pay | Source: Ambulatory Visit | Attending: Orthopaedic Surgery

## 2023-04-05 ENCOUNTER — Inpatient Hospital Stay (HOSPITAL_COMMUNITY)
Admission: RE | Admit: 2023-04-05 | Discharge: 2023-04-08 | DRG: 494 | Disposition: A | Discharge status: Skilled Nursing Facility | Payer: Medicare Other | Source: Ambulatory Visit | Attending: Orthopaedic Surgery | Admitting: Orthopaedic Surgery

## 2023-04-05 DIAGNOSIS — Z9889 Other specified postprocedural states: Secondary | ICD-10-CM

## 2023-04-05 DIAGNOSIS — S82872A Displaced pilon fracture of left tibia, initial encounter for closed fracture: Secondary | ICD-10-CM | POA: Diagnosis present

## 2023-04-05 DIAGNOSIS — Z8781 Personal history of (healed) traumatic fracture: Secondary | ICD-10-CM

## 2023-04-05 DIAGNOSIS — Y92814 Boat as the place of occurrence of the external cause: Secondary | ICD-10-CM

## 2023-04-05 DIAGNOSIS — Z7982 Long term (current) use of aspirin: Secondary | ICD-10-CM

## 2023-04-05 DIAGNOSIS — S82852A Displaced trimalleolar fracture of left lower leg, initial encounter for closed fracture: Secondary | ICD-10-CM | POA: Diagnosis not present

## 2023-04-05 DIAGNOSIS — S82851D Displaced trimalleolar fracture of right lower leg, subsequent encounter for closed fracture with routine healing: Secondary | ICD-10-CM | POA: Diagnosis not present

## 2023-04-05 DIAGNOSIS — S82402A Unspecified fracture of shaft of left fibula, initial encounter for closed fracture: Secondary | ICD-10-CM | POA: Diagnosis not present

## 2023-04-05 DIAGNOSIS — Z4789 Encounter for other orthopedic aftercare: Secondary | ICD-10-CM | POA: Diagnosis not present

## 2023-04-05 DIAGNOSIS — S8251XA Displaced fracture of medial malleolus of right tibia, initial encounter for closed fracture: Secondary | ICD-10-CM | POA: Diagnosis not present

## 2023-04-05 DIAGNOSIS — S8252XA Displaced fracture of medial malleolus of left tibia, initial encounter for closed fracture: Secondary | ICD-10-CM | POA: Diagnosis not present

## 2023-04-05 DIAGNOSIS — Z79899 Other long term (current) drug therapy: Secondary | ICD-10-CM

## 2023-04-05 DIAGNOSIS — S82841A Displaced bimalleolar fracture of right lower leg, initial encounter for closed fracture: Principal | ICD-10-CM | POA: Diagnosis present

## 2023-04-05 DIAGNOSIS — S82842A Displaced bimalleolar fracture of left lower leg, initial encounter for closed fracture: Secondary | ICD-10-CM | POA: Diagnosis not present

## 2023-04-05 DIAGNOSIS — S82841D Displaced bimalleolar fracture of right lower leg, subsequent encounter for closed fracture with routine healing: Secondary | ICD-10-CM | POA: Diagnosis not present

## 2023-04-05 DIAGNOSIS — S93401D Sprain of unspecified ligament of right ankle, subsequent encounter: Secondary | ICD-10-CM | POA: Diagnosis not present

## 2023-04-05 DIAGNOSIS — R531 Weakness: Secondary | ICD-10-CM | POA: Diagnosis not present

## 2023-04-05 DIAGNOSIS — R262 Difficulty in walking, not elsewhere classified: Secondary | ICD-10-CM | POA: Diagnosis not present

## 2023-04-05 DIAGNOSIS — S93432A Sprain of tibiofibular ligament of left ankle, initial encounter: Secondary | ICD-10-CM | POA: Diagnosis not present

## 2023-04-05 DIAGNOSIS — S82832A Other fracture of upper and lower end of left fibula, initial encounter for closed fracture: Secondary | ICD-10-CM | POA: Diagnosis not present

## 2023-04-05 DIAGNOSIS — Z743 Need for continuous supervision: Secondary | ICD-10-CM | POA: Diagnosis not present

## 2023-04-05 DIAGNOSIS — M6281 Muscle weakness (generalized): Secondary | ICD-10-CM | POA: Diagnosis not present

## 2023-04-05 DIAGNOSIS — W19XXXA Unspecified fall, initial encounter: Secondary | ICD-10-CM | POA: Diagnosis present

## 2023-04-05 DIAGNOSIS — Z01818 Encounter for other preprocedural examination: Principal | ICD-10-CM

## 2023-04-05 DIAGNOSIS — S82401A Unspecified fracture of shaft of right fibula, initial encounter for closed fracture: Secondary | ICD-10-CM | POA: Diagnosis not present

## 2023-04-05 DIAGNOSIS — S93402D Sprain of unspecified ligament of left ankle, subsequent encounter: Secondary | ICD-10-CM | POA: Diagnosis not present

## 2023-04-05 DIAGNOSIS — S93431A Sprain of tibiofibular ligament of right ankle, initial encounter: Secondary | ICD-10-CM | POA: Diagnosis not present

## 2023-04-05 HISTORY — PX: SYNDESMOSIS REPAIR: SHX5182

## 2023-04-05 HISTORY — PX: ORIF ANKLE FRACTURE: SHX5408

## 2023-04-05 LAB — SURGICAL PCR SCREEN
MRSA, PCR: NEGATIVE
Staphylococcus aureus: NEGATIVE

## 2023-04-05 LAB — CBC
HCT: 37 % (ref 36.0–46.0)
HCT: 35.2 % — ABNORMAL LOW (ref 36.0–46.0)
Hemoglobin: 11.7 g/dL — ABNORMAL LOW (ref 12.0–15.0)
Hemoglobin: 11.3 g/dL — ABNORMAL LOW (ref 12.0–15.0)
MCH: 30.2 pg (ref 26.0–34.0)
MCH: 29.9 pg (ref 26.0–34.0)
MCHC: 31.6 g/dL (ref 30.0–36.0)
MCHC: 32.1 g/dL (ref 30.0–36.0)
MCV: 95.4 fL (ref 80.0–100.0)
MCV: 93.1 fL (ref 80.0–100.0)
Platelets: 236 10*3/uL (ref 150–400)
Platelets: 237 10*3/uL (ref 150–400)
RBC: 3.88 MIL/uL (ref 3.87–5.11)
RBC: 3.78 MIL/uL — ABNORMAL LOW (ref 3.87–5.11)
RDW: 13 % (ref 11.5–15.5)
RDW: 12.8 % (ref 11.5–15.5)
WBC: 5.2 10*3/uL (ref 4.0–10.5)
WBC: 6.8 10*3/uL (ref 4.0–10.5)
nRBC: 0 % (ref 0.0–0.2)
nRBC: 0 % (ref 0.0–0.2)

## 2023-04-05 LAB — CREATININE, SERUM
Creatinine, Ser: 0.82 mg/dL (ref 0.44–1.00)
GFR, Estimated: >60 mL/min (ref >60)

## 2023-04-05 SURGERY — OPEN REDUCTION INTERNAL FIXATION (ORIF) ANKLE FRACTURE
Anesthesia: Spinal | Site: Ankle | Laterality: Bilateral

## 2023-04-05 MED ORDER — ONDANSETRON HCL 4 MG/2ML IJ SOLN: 4.0000 mg | Freq: Four times a day (QID) | INTRAMUSCULAR | Status: DC | PRN

## 2023-04-05 MED ORDER — HYDROCODONE-ACETAMINOPHEN 7.5-325 MG PO TABS
1.0000 | ORAL_TABLET | ORAL | Status: DC | PRN
  Administered 2023-04-05: 1 via ORAL
  Administered 2023-04-06 – 2023-04-08 (×11): 2 via ORAL
  Filled 2023-04-05 (×11): qty 2
  Filled 2023-04-05: qty 1
  Filled 2023-04-05: qty 2

## 2023-04-05 MED ORDER — CHLORHEXIDINE GLUCONATE 0.12 % MT SOLN
OROMUCOSAL | Status: AC
  Administered 2023-04-05: 15 mL via OROMUCOSAL
  Filled 2023-04-05: qty 15

## 2023-04-05 MED ORDER — VANCOMYCIN HCL 1000 MG IV SOLR
INTRAVENOUS | Status: AC
  Filled 2023-04-05: qty 40

## 2023-04-05 MED ORDER — CEFAZOLIN SODIUM-DEXTROSE 2-4 GM/100ML-% IV SOLN
INTRAVENOUS | Status: AC
  Filled 2023-04-05: qty 100

## 2023-04-05 MED ORDER — ORAL CARE MOUTH RINSE: 15.0000 mL | Freq: Once | OROMUCOSAL | Status: AC

## 2023-04-05 MED ORDER — FENTANYL CITRATE (PF) 100 MCG/2ML IJ SOLN
INTRAMUSCULAR | Status: AC
  Filled 2023-04-05: qty 2

## 2023-04-05 MED ORDER — SODIUM CHLORIDE 0.9 % IV SOLN
INTRAVENOUS | Status: DC | PRN
Start: 2023-04-05 — End: 2023-04-05

## 2023-04-05 MED ORDER — HYDROCODONE-ACETAMINOPHEN 5-325 MG PO TABS
1.0000 | ORAL_TABLET | ORAL | Status: DC | PRN
  Administered 2023-04-05: 1 via ORAL
  Administered 2023-04-07 (×2): 2 via ORAL
  Administered 2023-04-08 (×2): 1 via ORAL
  Filled 2023-04-05: qty 2
  Filled 2023-04-05 (×2): qty 1
  Filled 2023-04-05: qty 2
  Filled 2023-04-05: qty 1

## 2023-04-05 MED ORDER — SODIUM CHLORIDE 0.9 % IV SOLN: INTRAVENOUS | Status: AC

## 2023-04-05 MED ORDER — FENTANYL CITRATE (PF) 100 MCG/2ML IJ SOLN
25.0000 ug | INTRAMUSCULAR | Status: DC | PRN
  Administered 2023-04-05: 50 ug via INTRAVENOUS

## 2023-04-05 MED ORDER — LIDOCAINE 2% (20 MG/ML) 5 ML SYRINGE
INTRAMUSCULAR | Status: AC
  Filled 2023-04-05: qty 5

## 2023-04-05 MED ORDER — MORPHINE SULFATE (PF) 2 MG/ML IV SOLN
0.5000 mg | INTRAVENOUS | Status: DC | PRN
  Filled 2023-04-05: qty 1

## 2023-04-05 MED ORDER — MIDAZOLAM HCL 2 MG/2ML IJ SOLN
INTRAMUSCULAR | Status: AC
  Filled 2023-04-05: qty 2

## 2023-04-05 MED ORDER — CEFAZOLIN SODIUM-DEXTROSE 2-4 GM/100ML-% IV SOLN
2.0000 g | Freq: Three times a day (TID) | INTRAVENOUS | Status: AC
  Administered 2023-04-05 – 2023-04-06 (×3): 2 g via INTRAVENOUS
  Filled 2023-04-05 (×3): qty 100

## 2023-04-05 MED ORDER — CEFAZOLIN SODIUM-DEXTROSE 2-4 GM/100ML-% IV SOLN
2.0000 g | INTRAVENOUS | Status: AC
  Administered 2023-04-05: 2 g via INTRAVENOUS

## 2023-04-05 MED ORDER — PHENYLEPHRINE HCL-NACL 20-0.9 MG/250ML-% IV SOLN
INTRAVENOUS | Status: DC | PRN
Start: 2023-04-05 — End: 2023-04-05
  Administered 2023-04-05: 25 ug/min via INTRAVENOUS

## 2023-04-05 MED ORDER — FENTANYL CITRATE (PF) 250 MCG/5ML IJ SOLN
INTRAMUSCULAR | Status: AC
  Filled 2023-04-05: qty 5

## 2023-04-05 MED ORDER — ACETAMINOPHEN 10 MG/ML IV SOLN
1000.0000 mg | Freq: Once | INTRAVENOUS | Status: DC | PRN
  Administered 2023-04-05: 1000 mg via INTRAVENOUS

## 2023-04-05 MED ORDER — ACETAMINOPHEN 500 MG PO TABS
500.0000 mg | ORAL_TABLET | Freq: Four times a day (QID) | ORAL | Status: AC
  Administered 2023-04-06: 500 mg via ORAL
  Filled 2023-04-05 (×3): qty 1

## 2023-04-05 MED ORDER — PHENYLEPHRINE 80 MCG/ML (10ML) SYRINGE FOR IV PUSH (FOR BLOOD PRESSURE SUPPORT)
PREFILLED_SYRINGE | INTRAVENOUS | Status: AC
  Filled 2023-04-05: qty 10

## 2023-04-05 MED ORDER — DEXAMETHASONE SODIUM PHOSPHATE 10 MG/ML IJ SOLN
INTRAMUSCULAR | Status: AC
  Filled 2023-04-05: qty 1

## 2023-04-05 MED ORDER — LACTATED RINGERS IV SOLN: INTRAVENOUS | Status: DC

## 2023-04-05 MED ORDER — ACETAMINOPHEN 325 MG PO TABS
325.0000 mg | ORAL_TABLET | Freq: Four times a day (QID) | ORAL | Status: DC | PRN
  Administered 2023-04-08: 650 mg via ORAL
  Filled 2023-04-05: qty 2

## 2023-04-05 MED ORDER — ACETAMINOPHEN 10 MG/ML IV SOLN
INTRAVENOUS | Status: AC
  Filled 2023-04-05: qty 100

## 2023-04-05 MED ORDER — AMISULPRIDE (ANTIEMETIC) 5 MG/2ML IV SOLN: 10.0000 mg | Freq: Once | INTRAVENOUS | Status: DC | PRN

## 2023-04-05 MED ORDER — PROPOFOL 500 MG/50ML IV EMUL
INTRAVENOUS | Status: DC | PRN
  Administered 2023-04-05: 50 ug/kg/min via INTRAVENOUS

## 2023-04-05 MED ORDER — BUPIVACAINE IN DEXTROSE 0.75-8.25 % IT SOLN
INTRATHECAL | Status: DC | PRN
Start: 2023-04-05 — End: 2023-04-05
  Administered 2023-04-05: 1.6 mL via INTRATHECAL

## 2023-04-05 MED ORDER — ONDANSETRON HCL 4 MG/2ML IJ SOLN
INTRAMUSCULAR | Status: DC | PRN
Start: 2023-04-05 — End: 2023-04-05
  Administered 2023-04-05: 4 mg via INTRAVENOUS

## 2023-04-05 MED ORDER — PROPOFOL 10 MG/ML IV BOLUS
INTRAVENOUS | Status: DC | PRN
  Administered 2023-04-05: 20 mg via INTRAVENOUS

## 2023-04-05 MED ORDER — ONDANSETRON HCL 4 MG/2ML IJ SOLN: 4.0000 mg | Freq: Once | INTRAMUSCULAR | Status: DC | PRN

## 2023-04-05 MED ORDER — ONDANSETRON HCL 4 MG/2ML IJ SOLN
INTRAMUSCULAR | Status: AC
  Filled 2023-04-05: qty 2

## 2023-04-05 MED ORDER — VANCOMYCIN HCL 500 MG IV SOLR
INTRAVENOUS | Status: DC | PRN
  Administered 2023-04-05 (×2): 1000 mg via TOPICAL

## 2023-04-05 MED ORDER — POLYETHYLENE GLYCOL 3350 17 G PO PACK
17.0000 g | PACK | Freq: Every day | ORAL | Status: DC | PRN
  Administered 2023-04-05 – 2023-04-08 (×4): 17 g via ORAL
  Filled 2023-04-05 (×4): qty 1

## 2023-04-05 MED ORDER — DOCUSATE SODIUM 100 MG PO CAPS
100.0000 mg | ORAL_CAPSULE | Freq: Two times a day (BID) | ORAL | Status: DC
  Administered 2023-04-05 – 2023-04-08 (×6): 100 mg via ORAL
  Filled 2023-04-05 (×6): qty 1

## 2023-04-05 MED ORDER — ENOXAPARIN SODIUM 30 MG/0.3ML IJ SOSY
30.0000 mg | PREFILLED_SYRINGE | INTRAMUSCULAR | Status: DC
  Administered 2023-04-06 – 2023-04-08 (×3): 30 mg via SUBCUTANEOUS
  Filled 2023-04-05 (×3): qty 0.3

## 2023-04-05 MED ORDER — STERILE WATER FOR IRRIGATION IR SOLN
Status: DC | PRN
  Administered 2023-04-05: 1000 mL

## 2023-04-05 MED ORDER — LIDOCAINE HCL (CARDIAC) PF 100 MG/5ML IV SOSY
PREFILLED_SYRINGE | INTRAVENOUS | Status: DC | PRN
  Administered 2023-04-05: 20 mg via INTRAVENOUS

## 2023-04-05 MED ORDER — CHLORHEXIDINE GLUCONATE 0.12 % MT SOLN: 15.0000 mL | Freq: Once | OROMUCOSAL | Status: AC

## 2023-04-05 MED ORDER — DEXAMETHASONE SODIUM PHOSPHATE 10 MG/ML IJ SOLN
INTRAMUSCULAR | Status: DC | PRN
Start: 2023-04-05 — End: 2023-04-05
  Administered 2023-04-05: 5 mg via INTRAVENOUS

## 2023-04-05 MED ORDER — 0.9 % SODIUM CHLORIDE (POUR BTL) OPTIME
TOPICAL | Status: DC | PRN
Start: 2023-04-05 — End: 2023-04-05
  Administered 2023-04-05: 1000 mL

## 2023-04-05 MED ORDER — PHENYLEPHRINE 80 MCG/ML (10ML) SYRINGE FOR IV PUSH (FOR BLOOD PRESSURE SUPPORT)
PREFILLED_SYRINGE | INTRAVENOUS | Status: DC | PRN
Start: 2023-04-05 — End: 2023-04-05
  Administered 2023-04-05 (×6): 80 ug via INTRAVENOUS

## 2023-04-05 MED ORDER — MIDAZOLAM HCL 2 MG/2ML IJ SOLN
INTRAMUSCULAR | Status: DC | PRN
  Administered 2023-04-05: 2 mg via INTRAVENOUS

## 2023-04-05 MED ORDER — ONDANSETRON HCL 4 MG PO TABS: 4.0000 mg | ORAL_TABLET | Freq: Four times a day (QID) | ORAL | Status: DC | PRN

## 2023-04-05 MED ORDER — CHLORHEXIDINE GLUCONATE 4 % EX SOLN: 60.0000 mL | Freq: Once | CUTANEOUS | Status: DC

## 2023-04-05 MED ORDER — FENTANYL CITRATE (PF) 250 MCG/5ML IJ SOLN
INTRAMUSCULAR | Status: DC | PRN
Start: 2023-04-05 — End: 2023-04-05
  Administered 2023-04-05 (×2): 25 ug via INTRAVENOUS

## 2023-04-05 MED ORDER — PROPOFOL 10 MG/ML IV BOLUS
INTRAVENOUS | Status: AC
  Filled 2023-04-05: qty 20

## 2023-04-05 SURGICAL SUPPLY — 99 items
ANCHOR SUT KEITH ABD SZ2 STR (SUTURE) ×2 IMPLANT
APL PRP STRL LF DISP 70% ISPRP (MISCELLANEOUS) ×4
BAG COUNTER SPONGE SURGICOUNT (BAG) IMPLANT
BAG SPNG CNTER NS LX DISP (BAG)
BANDAGE ESMARK 6X9 LF (GAUZE/BANDAGES/DRESSINGS) ×2 IMPLANT
BIT DRILL 2.4X140 LONG SOLID (BIT) IMPLANT
BIT DRILL 2.5X2.75 QC CALB (BIT) IMPLANT
BLADE LONG MED 31X9 (MISCELLANEOUS) ×2 IMPLANT
BLADE SURG 15 STRL LF DISP TIS (BLADE) ×4 IMPLANT
BLADE SURG 15 STRL SS (BLADE) ×4
BNDG CMPR 5X4 CHSV STRCH STRL (GAUZE/BANDAGES/DRESSINGS) ×2
BNDG CMPR 5X6 CHSV STRCH STRL (GAUZE/BANDAGES/DRESSINGS) ×2
BNDG CMPR 9X6 STRL LF SNTH (GAUZE/BANDAGES/DRESSINGS) ×2
BNDG CMPR MED 15X6 ELC VLCR LF (GAUZE/BANDAGES/DRESSINGS) ×4
BNDG COHESIVE 4X5 TAN STRL LF (GAUZE/BANDAGES/DRESSINGS) ×2 IMPLANT
BNDG COHESIVE 6X5 TAN NS LF (GAUZE/BANDAGES/DRESSINGS) ×2 IMPLANT
BNDG COHESIVE 6X5 TAN ST LF (GAUZE/BANDAGES/DRESSINGS) ×2 IMPLANT
BNDG ELASTIC 4X5.8 VLCR STR LF (GAUZE/BANDAGES/DRESSINGS) ×2 IMPLANT
BNDG ELASTIC 6X15 VLCR STRL LF (GAUZE/BANDAGES/DRESSINGS) IMPLANT
BNDG ELASTIC 6X5.8 VLCR STR LF (GAUZE/BANDAGES/DRESSINGS) ×2 IMPLANT
BNDG ESMARK 6X9 LF (GAUZE/BANDAGES/DRESSINGS) ×2
BNDG GAUZE DERMACEA FLUFF 4 (GAUZE/BANDAGES/DRESSINGS) ×2 IMPLANT
BNDG GZE DERMACEA 4 6PLY (GAUZE/BANDAGES/DRESSINGS) ×4
CHLORAPREP W/TINT 26 (MISCELLANEOUS) ×4 IMPLANT
COVER SURGICAL LIGHT HANDLE (MISCELLANEOUS) ×2 IMPLANT
CUFF TOURN SGL QUICK 34 (TOURNIQUET CUFF) ×2
CUFF TRNQT CYL 34X4.125X (TOURNIQUET CUFF) ×2 IMPLANT
DRAPE C-ARM 42X120 X-RAY (DRAPES) IMPLANT
DRAPE C-ARM MINI 42X72 WSTRAPS (DRAPES) ×2 IMPLANT
DRAPE C-ARMOR (DRAPES) ×2 IMPLANT
DRAPE EXTREMITY T 121X128X90 (DISPOSABLE) ×2 IMPLANT
DRAPE OEC MINIVIEW 54X84 (DRAPES) ×2 IMPLANT
DRAPE ORTHO SPLIT 77X108 STRL (DRAPES) ×4
DRAPE SURG ORHT 6 SPLT 77X108 (DRAPES) ×4 IMPLANT
DRAPE U-SHAPE 47X51 STRL (DRAPES) ×2 IMPLANT
DRSG ADAPTIC 3X8 NADH LF (GAUZE/BANDAGES/DRESSINGS) ×2 IMPLANT
DRSG MEPITEL 4X7.2 (GAUZE/BANDAGES/DRESSINGS) IMPLANT
ELECT REM PT RETURN 15FT ADLT (MISCELLANEOUS) ×2 IMPLANT
FACESHIELD WRAPAROUND (MASK) ×2 IMPLANT
FACESHIELD WRAPAROUND OR TEAM (MASK) ×2 IMPLANT
GAUZE PAD ABD 8X10 STRL (GAUZE/BANDAGES/DRESSINGS) ×10 IMPLANT
GAUZE SPONGE 4X4 12PLY STRL (GAUZE/BANDAGES/DRESSINGS) ×4 IMPLANT
GAUZE XEROFORM 5X9 LF (GAUZE/BANDAGES/DRESSINGS) ×2 IMPLANT
GLOVE SURG ENC MOIS LTX SZ7.5 (GLOVE) ×4 IMPLANT
GLOVE SURG MICRO LTX SZ7.5 (GLOVE) ×4 IMPLANT
GLOVE SURG POLYISO LF SZ7.5 (GLOVE) ×2 IMPLANT
GLOVE SURG UNDER POLY LF SZ7.5 (GLOVE) ×4 IMPLANT
GOWN STRL REUS W/ TWL LRG LVL3 (GOWN DISPOSABLE) ×2 IMPLANT
GOWN STRL REUS W/TWL LRG LVL3 (GOWN DISPOSABLE) ×2
K-WIRE ACE 1.6X6 (WIRE) ×8
KIT BASIN OR (CUSTOM PROCEDURE TRAY) ×2 IMPLANT
KIT TURNOVER KIT A (KITS) IMPLANT
KWIRE ACE 1.6X6 (WIRE) IMPLANT
NDL HYPO 22X1.5 SAFETY MO (MISCELLANEOUS) ×2 IMPLANT
NDL MAYO CATGUT SZ4 TPR NDL (NEEDLE) IMPLANT
NEEDLE HYPO 22X1.5 SAFETY MO (MISCELLANEOUS) IMPLANT
NEEDLE MAYO CATGUT SZ4 (NEEDLE) IMPLANT
NS IRRIG 1000ML POUR BTL (IV SOLUTION) ×2 IMPLANT
PACK ORTHO EXTREMITY (CUSTOM PROCEDURE TRAY) ×2 IMPLANT
PAD ABD 8X10 STRL (GAUZE/BANDAGES/DRESSINGS) IMPLANT
PAD CAST 4YDX4 CTTN HI CHSV (CAST SUPPLIES) ×12 IMPLANT
PADDING CAST COTTON 4X4 STRL (CAST SUPPLIES) ×12
PADDING CAST COTTON 6X4 STRL (CAST SUPPLIES) ×2 IMPLANT
PADDING CAST SYNTHETIC 4X4 STR (CAST SUPPLIES) IMPLANT
PADDING CAST SYNTHETIC 6X4 NS (CAST SUPPLIES) IMPLANT
PLATE LOCK 7H 92 BILAT FIB (Plate) IMPLANT
PLATE MEDIAL MALLEOLUS 4H HOOK (Plate) IMPLANT
PROTECTOR NERVE ULNAR (MISCELLANEOUS) ×2 IMPLANT
PUTTY DBM STAGRAFT PLUS 2CC (Putty) IMPLANT
SCREW CORTICAL 3.5MM 12MM (Screw) IMPLANT
SCREW CORTICAL 3.5MM 14MM (Screw) IMPLANT
SCREW CORTICAL 3.5MM 20MM (Screw) IMPLANT
SCREW LOCK 3.5X12 DIST TIB (Screw) IMPLANT
SCREW LOCK 3.5X14 DIST TIB (Screw) IMPLANT
SCREW LOCK 3.5X44 DIST TIB (Screw) IMPLANT
SCREW LOCK CORT STAR 3.5X12 (Screw) IMPLANT
SCREW LOCK CORT STAR 3.5X14 (Screw) IMPLANT
SCREW LOCK CORT STAR 3.5X16 (Screw) IMPLANT
SCREW LOCK PLATE R3 3.5X22 (Screw) IMPLANT
SCREW LOCK PLATE R3CON 3.5X8 (Screw) IMPLANT
SCREW LOW PROFILE 12MMX3.5MM (Screw) IMPLANT
SCREW NON LOCKING LP 3.5 14MM (Screw) IMPLANT
SPIKE FLUID TRANSFER (MISCELLANEOUS) IMPLANT
SPONGE T-LAP 18X18 ~~LOC~~+RFID (SPONGE) ×2 IMPLANT
STAPLER VISISTAT 35W (STAPLE) ×2 IMPLANT
STOCKINETTE TUBULAR 6 INCH (GAUZE/BANDAGES/DRESSINGS) ×2 IMPLANT
STOCKINETTE TUBULAR COTT 4X25 (GAUZE/BANDAGES/DRESSINGS) ×2 IMPLANT
STRIP CLOSURE SKIN 1/2X4 (GAUZE/BANDAGES/DRESSINGS) ×2 IMPLANT
SUCTION TUBE FRAZIER 10FR DISP (SUCTIONS) ×2 IMPLANT
SUT ETHILON 3 0 PS 1 (SUTURE) ×2 IMPLANT
SUT MON AB 3-0 SH 27 (SUTURE) ×2
SUT MON AB 3-0 SH27 (SUTURE) ×2 IMPLANT
SUT VIC AB 2-0 SH 27 (SUTURE) ×2
SUT VIC AB 2-0 SH 27XBRD (SUTURE) ×2 IMPLANT
SUT VIC AB 3-0 SH 27 (SUTURE) ×4
SUT VIC AB 3-0 SH 27X BRD (SUTURE) ×4 IMPLANT
SYR CONTROL 10ML LL (SYRINGE) ×2 IMPLANT
TRAY FOLEY W/BAG SLVR 14FR (SET/KITS/TRAYS/PACK) IMPLANT
WATER STERILE IRR 1000ML POUR (IV SOLUTION) ×2 IMPLANT

## 2023-04-05 NOTE — Anesthesia Procedure Notes (Signed)
Spinal  Patient location during procedure: OR Start time: 04/05/2023 1:20 PM End time: 04/05/2023 1:25 PM Reason for block: surgical anesthesia Staffing Performed: anesthesiologist  Anesthesiologist: Leonides Grills, MD Performed by: Leonides Grills, MD Authorized by: Leonides Grills, MD   Preanesthetic Checklist Completed: patient identified, IV checked, risks and benefits discussed, surgical consent, monitors and equipment checked, pre-op evaluation and timeout performed Spinal Block Patient position: sitting Prep: DuraPrep Patient monitoring: cardiac monitor, continuous pulse ox and blood pressure Approach: midline Location: L4-5 Injection technique: single-shot Needle Needle type: Pencan  Needle gauge: 24 G Needle length: 9 cm Assessment Sensory level: T10 Events: CSF return Additional Notes Functioning IV was confirmed and monitors were applied. Sterile prep and drape, including hand hygiene and sterile gloves were used. The patient was positioned and the spine was prepped. The skin was anesthetized with lidocaine.  Free flow of clear CSF was obtained prior to injecting local anesthetic into the CSF.  The spinal needle aspirated freely following injection.  The needle was carefully withdrawn.  The patient tolerated the procedure well.

## 2023-04-05 NOTE — H&P (Signed)

## 2023-04-05 NOTE — Transfer of Care (Signed)
Immediate Anesthesia Transfer of Care Note  Patient: Tracey Sparks  Procedure(s) Performed: OPEN REDUCTION INTERNAL FIXATION (ORIF) BILATERAL BIMALLEOLAR ANKLE FRACTURE (Bilateral: Ankle) SYNDESMOSIS REPAIR (Bilateral)  Patient Location: PACU  Anesthesia Type:Spinal  Level of Consciousness: awake, alert , and oriented  Airway & Oxygen Therapy: Patient Spontanous Breathing  Post-op Assessment: Report given to RN, Post -op Vital signs reviewed and stable, and Patient moving all extremities X 4- able to slightly bend both knees and wiggle toes.  Post vital signs: Reviewed and stable  Last Vitals:  Vitals Value Taken Time  BP 122/69 04/05/23 1601  Temp    Pulse 86 04/05/23 1603  Resp 14 04/05/23 1603  SpO2 93 % 04/05/23 1603  Vitals shown include unfiled device data.  Last Pain: There were no vitals filed for this visit.       Complications: No notable events documented.

## 2023-04-05 NOTE — Anesthesia Postprocedure Evaluation (Signed)
Anesthesia Post Note  Patient: Tracey Sparks  Procedure(s) Performed: OPEN REDUCTION INTERNAL FIXATION (ORIF) BILATERAL BIMALLEOLAR ANKLE FRACTURE (Bilateral: Ankle) SYNDESMOSIS REPAIR (Bilateral)     Patient location during evaluation: PACU Anesthesia Type: Spinal Level of consciousness: awake Pain management: pain level controlled Vital Signs Assessment: post-procedure vital signs reviewed and stable Respiratory status: spontaneous breathing, nonlabored ventilation and respiratory function stable Cardiovascular status: blood pressure returned to baseline and stable Postop Assessment: no apparent nausea or vomiting Anesthetic complications: no   No notable events documented.  Last Vitals:  Vitals:   04/05/23 1645 04/05/23 1725  BP: 97/82 115/71  Pulse: 84 82  Resp: 14 16  Temp: 36.7 C 36.7 C  SpO2: 95% 99%    Last Pain:  Vitals:   04/05/23 1725  TempSrc: Oral  PainSc:                  Catheryn Bacon Amanuel [place]

## 2023-04-05 NOTE — Anesthesia Preprocedure Evaluation (Signed)
Anesthesia Evaluation  Patient identified by MRN, date of birth, ID band Patient awake    Reviewed: Allergy & Precautions, NPO status , Patient's Chart, lab work & pertinent test results  Airway Mallampati: II  TM Distance: >3 FB Neck ROM: Full    Dental no notable dental hx.    Pulmonary neg pulmonary ROS   Pulmonary exam normal breath sounds clear to auscultation       Cardiovascular negative cardio ROS Normal cardiovascular exam     Neuro/Psych negative neurological ROS  negative psych ROS   GI/Hepatic negative GI ROS, Neg liver ROS,,,  Endo/Other  negative endocrine ROS    Renal/GU Renal disease     Musculoskeletal negative musculoskeletal ROS (+)    Abdominal   Peds  Hematology negative hematology ROS (+)   Anesthesia Other Findings Closed bimalleolar fracture of bilateral ankles  Reproductive/Obstetrics                             Anesthesia Physical Anesthesia Plan  ASA: 2  Anesthesia Plan: Spinal   Post-op Pain Management:    Induction: Intravenous  PONV Risk Score and Plan: 2 and Ondansetron, Dexamethasone, Propofol infusion, Midazolam and Treatment may vary due to age or medical condition  Airway Management Planned: Simple Face Mask  Additional Equipment:   Intra-op Plan:   Post-operative Plan:   Informed Consent: I have reviewed the patients History and Physical, chart, labs and discussed the procedure including the risks, benefits and alternatives for the proposed anesthesia with the patient or authorized representative who has indicated his/her understanding and acceptance.     Dental advisory given  Plan Discussed with: CRNA and Surgeon  Anesthesia Plan Comments:        Anesthesia Quick Evaluation

## 2023-04-05 NOTE — Op Note (Signed)
04/05/2023  4:28 PM   PATIENT: Tracey Sparks  67 y.o. female  MRN: [id number]   PRE-OPERATIVE DIAGNOSIS:   Closed bimalleolar fracture of right ankle; Closed trimalleolar fracture of left ankle   POST-OPERATIVE DIAGNOSIS:   Same   PROCEDURE: 1] ORIF right bimalleolar ankle fracture 2] ORIF right ankle syndesmosis  3] ORIF left trimalleolar ankle fracture without fixation of posterior malleolus 4] ORIF left ankle syndesmosis    SURGEON:  Netta Cedars, MD   ASSISTANT: None   ANESTHESIA: General, regional   EBL: Minimal   TOURNIQUET:    Total Tourniquet Time Documented: Thigh (Left) - 59 minutes Total: Thigh (Left) - 59 minutes  Thigh (Right) - -69 minutes Total: Thigh (Right) - -69 minutes    COMPLICATIONS: None apparent   DISPOSITION: Extubated, awake and stable to recovery.   INDICATION FOR PROCEDURE: The patient presented with above diagnosis.  We discussed the diagnosis, alternative treatment options, risks and benefits of the above surgical intervention, as well as alternative non-operative treatments. All questions/concerns were addressed and the patient/family demonstrated appropriate understanding of the diagnosis, the procedure, the postoperative course, and overall prognosis. The patient wished to proceed with surgical intervention and signed an informed surgical consent as such, in each others presence prior to surgery.   PROCEDURE IN DETAIL: After preoperative consent was obtained and the correct operative site was identified, the patient was brought to the operating room supine on stretcher and transferred onto operating table. General anesthesia was induced. Preoperative antibiotics were administered. Surgical timeout was taken. The patient was then positioned supine with an ipsilateral hip bump. The operative lower extremity was prepped and draped in standard sterile fashion with a tourniquet around the thigh. The extremity was  exsanguinated and the tourniquet was inflated to 275 mmHg.  1] Right Ankle  A standard lateral incision was made over the distal fibula. Dissection was carried down to the level of the fibula and the fracture site identified. The superficial peroneal nerve was identified and protected throughout the procedure. The fibula was noted to be shortened with interposed periosteum. The fibula was brought out to length. The fibula fracture was debrided and the edges defined to achieve cortical read. Reduction maneuver was performed using pointed reduction forceps and lobster forceps. In this manner, the fibula length was restored and fracture reduced. A lag screw was not placed given the orientation of fracture lines and comminution. Due to poor bone quality and extensive comminution at the fracture site, it was decided to use a locking distal fibula plate. We then selected a Zimmer locking plate to match the anatomy of the distal fibula and placed it laterally. This was implanted under intraoperative fluoroscopy with a combination of distal locking screws and proximal cortical & locking screws.  We then made a direct medial ankle approach and extended this proximally in anticipation of implanting a hook plate. Dissection was carried down to the level of the medial malleolar fragment. A dental pick and freer elevator were used to reduce the medial malleolar fragment. Of note, there was extensive comminution of this fragment into multiple segments, all of which had very poor bone quality. A Paragon28 medial distal tibia hook plate was utilized to fix the reduced medial malleolar fragment and the tines were carefully inserted into the distal tip of the malleolus. The plate was oriented to best capture the major fragments of the medial comminution. We placed a locking screw in the hook plate and subsequently implanted another locking screw distally to further  secure the plate.  A manual external rotation stress  radiograph was obtained and demonstrated widening of the ankle mortise. Given this intraoperative finding as well as preoperative subluxation, it was decided to reduce and fix the syndesmosis. Therefore a tricortical locking screw was implanted through the fibula plate in appropriate fashion to fix the syndesmosis. Screw position was verified along anteromedial tibial cortex by fluoroscopy. A repeat stress radiograph showed complete stability of the ankle mortise to testing.  The surgical sites were thoroughly irrigated. The tourniquet was deflated and hemostasis achieved. Betadine and vancomycin powder were applied. The deep layers were closed using 2-0 vicryl. The skin was closed without tension using 2-0 nylon suture.    2] Left Ankle  A standard lateral incision was made over the distal fibula. Dissection was carried down to the level of the fibula and the fracture site identified. The superficial peroneal nerve was identified and protected throughout the procedure. The fibula was noted to be shortened with interposed periosteum. The fibula was brought out to length. The fibula fracture was debrided and the edges defined to achieve cortical read. Reduction maneuver was performed using pointed reduction forceps and lobster forceps. In this manner, the fibula length was restored and fracture reduced. A lag screw was not placed given the orientation of fracture lines and comminution. Due to poor bone quality and extensive comminution at the fracture site, it was decided to use a locking distal fibula plate. We then selected a Zimmer locking plate to match the anatomy of the distal fibula and placed it laterally. This was implanted under intraoperative fluoroscopy with a combination of distal locking screws and proximal cortical & locking screws.  We then made a direct medial ankle approach and extended this proximally in anticipation of implanting a hook plate. Dissection was carried down to the level of  the medial malleolar fragment. A dental pick and freer elevator were used to reduce the medial malleolar fragment. Of note, there was extensive comminution of this fragment into multiple segments, all of which had very poor bone quality. A large osteochondral fragment was excised from within the medial gutter.  A Paragon28 medial distal tibia hook plate was utilized to fix the reduced medial malleolar fragment and the tines were carefully inserted into the distal tip of the malleolus. The plate was oriented to best capture the major fragments of the medial comminution. We placed a locking screw in the hook plate and subsequently implanted another locking screw distally to further secure the plate.  A manual external rotation stress radiograph was obtained and demonstrated widening of the ankle mortise. Given this intraoperative finding as well as preoperative subluxation, it was decided to reduce and fix the syndesmosis. Therefore a tricortical locking screw was implanted through the fibula plate in appropriate fashion to fix the syndesmosis. Screw position was verified along anteromedial tibial cortex by fluoroscopy. A repeat stress radiograph showed complete stability of the ankle mortise to testing.  Due to severe comminution and bone loss over fibula fracture site, demineralized bone matrix putty was applied to the area.  The surgical sites were thoroughly irrigated. The tourniquet was deflated and hemostasis achieved. Betadine and vancomycin powder were applied. The deep layers were closed using 2-0 vicryl. The skin was closed without tension using 2-0 nylon suture.   3] Conclusion  Both legs were cleaned with saline and sterile dressings with gauze were applied. A well padded bulky short leg splint was applied to each leg. The patient was awakened from anesthesia and  transported to the recovery room in stable condition.    FOLLOW UP PLAN: -transfer to PACU, then admit to RNF under  Ortho -strict NWB bilateral extremity, maximum elevation -maintain short leg splints until follow up -DVT ppx: Lovenox during admission, Aspirin 81 mg twice daily while NWB -PT/OT while inpatient -meds sent thru outpatient EMR -Case Management to assist with SNF placement (patient has choices already picked in order) -follow up as outpatient within 7-10 days for wound check with exchange of short leg splint to short leg cast -sutures out in 2-3 weeks in outpatient office   RADIOGRAPHS: 1] Right Ankle AP, lateral, oblique and stress radiographs of the right ankle were obtained intraoperatively. These showed interval reduction and fixation of the fractures. Manual stress radiographs were taken and the ankle mortise and tibiofibular relationship were noted to be stable following fixation. All hardware is appropriately positioned and of the appropriate lengths. No other acute injuries are noted.  2] Left Ankle AP, lateral, oblique and stress radiographs of the right ankle were obtained intraoperatively. These showed interval reduction and fixation of the fractures. Manual stress radiographs were taken and the ankle mortise and tibiofibular relationship were noted to be stable following fixation. All hardware is appropriately positioned and of the appropriate lengths. No other acute injuries are noted.  Netta Cedars Orthopa[facility]

## 2023-04-06 NOTE — Plan of Care (Signed)

## 2023-04-06 NOTE — Evaluation (Signed)
Occupational Therapy Evaluation Patient Details Name: Tracey Sparks MRN: [id number] DOB: 06-Apr-1956 Today's Date: 04/06/2023   History of Present Illness Pt is a 67 year old female admitted to [facility] for closed bimalleolar fracture of right ankle; Closed trimalleolar fracture of left ankle, ORIF performed 04/05/23. PMH: kidney stones.   Clinical Impression   PTA Patient independent and driving. Admitted for above and presents with problem list below.  She requires intermittent cueing to adhere to NWB BLEs during transfers, cueing for techniques as learning how to mobilize.  Requires min assist for bed mobility, min assist +2 for anterior/posterior transfer into recliner, and setup to max assist for ADLs.  Remains limited by pain and activity tolerance.  Based on performance today, believe pt will best benefit from continued OT services acutely and after dc at an inpatient setting with >3hrs/day to optimize independence, safety and return to PLOF with ADLs and mobility.      If plan is discharge home, recommend the following: A lot of help with bathing/dressing/bathroom;Two people to help with walking and/or transfers;Assistance with cooking/housework;Assist for transportation;Help with stairs or ramp for entrance    Functional Status Assessment  Patient has had a recent decline in their functional status and demonstrates the ability to make significant improvements in function in a reasonable and predictable amount of time.  Equipment Recommendations  Other (comment) (TBD- may need drop arm BSC)    Recommendations for Other Services Rehab consult     Precautions / Restrictions Precautions Precautions: Fall Restrictions Weight Bearing Restrictions: Yes RLE Weight Bearing: Non weight bearing LLE Weight Bearing: Non weight bearing Other Position/Activity Restrictions: Keep B LEs elevated      Mobility Bed Mobility Overal bed mobility: Needs Assistance Bed Mobility: Supine to Sit,  Sit to Supine     Supine to sit: Min assist, Used rails Sit to supine: Min assist, Used rails   General bed mobility comments: min A for movement of B LEs due to pain    Transfers Overall transfer level: Needs assistance   Transfers: Bed to chair/wheelchair/BSC         Anterior-Posterior transfers: Min assist, +2 physical assistance   General transfer comment: cueing for technique and to ensure NWB to B LEs, scooting posteriorly into recliner with min assist +2      Balance     Sitting balance-Leahy Scale: Fair                                     ADL either performed or assessed with clinical judgement   ADL Overall ADL's : Needs assistance/impaired     Grooming: Set up;Sitting           Upper Body Dressing : Set up;Sitting   Lower Body Dressing: Maximal assistance;Sitting/lateral leans;Bed level   Toilet Transfer: Minimal assistance;+2 for physical assistance;Anterior/posterior Statistician Details (indicate cue type and reason): bed to chair         Functional mobility during ADLs: Minimal assistance;+2 for physical assistance       Vision   Vision Assessment?: No apparent visual deficits     Perception         Praxis         Pertinent Vitals/Pain Pain Assessment Pain Assessment: 0-10 Pain Score: 6  Pain Location: B LEs Pain Descriptors / Indicators: Discomfort, Grimacing, Guarding, Sore Pain Intervention(s): Limited activity within patient's tolerance, Monitored during session, Repositioned  Extremity/Trunk Assessment Upper Extremity Assessment Upper Extremity Assessment: Overall WFL for tasks assessed   Lower Extremity Assessment Lower Extremity Assessment: Defer to PT evaluation RLE Deficits / Details: Ankle fracture/s/p ORIF LLE Deficits / Details: Ankle fx. S/P ORIF   Cervical / Trunk Assessment Cervical / Trunk Assessment: Normal   Communication Communication Communication: No apparent  difficulties Cueing Techniques: Verbal cues   Cognition Arousal: Alert Behavior During Therapy: WFL for tasks assessed/performed Overall Cognitive Status: Within Functional Limits for tasks assessed                                       General Comments  spouse present and supportive    Exercises     Shoulder Instructions      Home Living Family/patient expects to be discharged to:: Private residence Living Arrangements: Spouse/significant other Available Help at Discharge: Family;Available 24 hours/day Type of Home: House Home Access: Ramped entrance     Home Layout: Two level;Able to live on main level with bedroom/bathroom     Bathroom Shower/Tub: Walk-in shower;Door   [employer] Toilet: Handicapped height Bathroom Accessibility: Yes   Home Equipment: Wheelchair - manual;BSC/3in1;Shower seat          Prior Functioning/Environment Prior Level of Function : Independent/Modified Independent                        OT Problem List: Decreased strength;Decreased activity tolerance;Decreased knowledge of use of DME or AE;Decreased knowledge of precautions;Pain;Impaired balance (sitting and/or standing)      OT Treatment/Interventions: Self-care/ADL training;DME and/or AE instruction;Therapeutic activities;Patient/family education;Balance training;Therapeutic exercise    OT Goals(Current goals can be found in the care plan section) Acute Rehab OT Goals Patient Stated Goal: get better OT Goal Formulation: With patient Time For Goal Achievement: 04/20/23 Potential to Achieve Goals: Good  OT Frequency: Min 1X/week    Co-evaluation PT/OT/SLP Co-Evaluation/Treatment: Yes Reason for Co-Treatment: For patient/therapist safety;To address functional/ADL transfers PT goals addressed during session: Mobility/safety with mobility;Balance;Proper use of DME OT goals addressed during session: ADL's and self-care      AM-PAC OT "6 Clicks" Daily Activity      Outcome Measure Help from another person eating meals?: None Help from another person taking care of personal grooming?: A Little Help from another person toileting, which includes using toliet, bedpan, or urinal?: A Lot Help from another person bathing (including washing, rinsing, drying)?: A Little Help from another person to put on and taking off regular upper body clothing?: A Little Help from another person to put on and taking off regular lower body clothing?: A Lot 6 Click Score: 17   End of Session Nurse Communication: Mobility status  Activity Tolerance: Patient tolerated treatment well Patient left: in chair;with call bell/phone within reach;with family/visitor present  OT Visit Diagnosis: Other abnormalities of gait and mobility (R26.89);Pain Pain - part of body: Ankle and joints of foot (bil)                Time: 8469-6295 OT Time Calculation (min): 32 min Charges:  OT General Charges $OT Visit: 1 Visit OT Evaluation $OT Eval Moderate Complexity: 1 Mod  Barry Brunner, OT Acute Rehabilitation Services Office [phone]   Chancy Milroy 04/06/2023, 11:02 AM

## 2023-04-06 NOTE — NC FL2 (Signed)
[place] MEDICAID FL2 LEVEL OF CARE FORM     IDENTIFICATION  Patient Name: Tracey Sparks Birthdate: 1955/10/10 Sex: female Admission Date (Current Location): 04/05/2023  [facility] [place] and [place] Number:  Producer, television/film/video and Address:  [facility], 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    Orientation RESPIRATION BLADDER Height & Weight     Self, Time, Situation, Place  Normal Continent Weight: 150 lb (68 kg) Height:  5\' 2"  (157.5 cm)  BEHAVIORAL SYMPTOMS/MOOD NEUROLOGICAL BOWEL NUTRITION STATUS      Continent Diet (see discharge summary)  AMBULATORY STATUS COMMUNICATION OF NEEDS Skin   Total Care Verbally Surgical wounds                       Personal Care Assistance Level of Assistance  Bathing, Feeding, Dressing Bathing Assistance: Maximum assistance Feeding assistance: Limited assistance Dressing Assistance: Maximum assistance     Functional Limitations Info  Sight, Hearing, Speech Sight Info: Adequate Hearing Info: Adequate Speech Info: Adequate    SPECIAL CARE FACTORS FREQUENCY  PT (By licensed PT), OT (By licensed OT)     PT Frequency: 5x week OT Frequency: 5x week            Contractures Contractures Info: Not present    Additional Factors Info  Code Status, Allergies Code Status Info: full Allergies Info: NKA            Current Medications (04/06/2023):  This is the current hospital active medication list Current Facility-Administered Medications  Medication Dose Route Frequency Provider Last Rate Last Admin   0.9 %  sodium chloride infusion   Intravenous Continuous Netta Cedars, MD 75 mL/hr at 04/05/23 1732 New Bag at 04/05/23 1732   acetaminophen (TYLENOL) tablet 325-650 mg  325-650 mg Oral Q6H PRN Netta Cedars, MD       acetaminophen (TYLENOL) tablet 500 mg  500 mg Oral Q6H Netta Cedars, MD   500 mg at 04/06/23 0523   ceFAZolin (ANCEF) IVPB 2g/100 mL premix  2 g Intravenous Q8H Netta Cedars, MD 200 mL/hr at 04/06/23 0525 2 g at 04/06/23 0525   docusate sodium (COLACE) capsule 100 mg  100 mg Oral BID Netta Cedars, MD   100 mg at 04/06/23 0827   enoxaparin (LOVENOX) injection 30 mg  30 mg Subcutaneous Q24H Netta Cedars, MD   30 mg at 04/06/23 0827   HYDROcodone-acetaminophen (NORCO) 7.5-325 MG per tablet 1-2 tablet  1-2 tablet Oral Q4H PRN Netta Cedars, MD   2 tablet at 04/06/23 0955   HYDROcodone-acetaminophen (NORCO/VICODIN) 5-325 MG per tablet 1-2 tablet  1-2 tablet Oral Q4H PRN Netta Cedars, MD   1 tablet at 04/05/23 1755   morphine (PF) 2 MG/ML injection 0.5-1 mg  0.5-1 mg Intravenous Q2H PRN Netta Cedars, MD       ondansetron (ZOFRAN) tablet 4 mg  4 mg Oral  Q6H PRN Netta Cedars, MD       Or   ondansetron [facility]) injection 4 mg  4 mg Intravenous Q6H PRN Netta Cedars, MD       polyethylene glycol (MIRALAX / GLYCOLAX) packet 17 g  17 g Oral Daily PRN Netta Cedars, MD   17 g at 04/05/23 1755     Discharge Medications: Please see discharge summary for a list of discharge medications.  Relevant Imaging Results:  Relevant Lab Results:   Additional Information SSN: [id number]  Lorri Frederick, LCSW

## 2023-04-06 NOTE — Progress Notes (Signed)
Inpatient Rehab Admissions Coordinator:   Per therapy recommendations, patient was screened for CIR candidacy by Bethann Qualley, MS, CCC-SLP. At this time, Pt. does not appear to demonstrate medical necessity to justify in hospital rehabilitation/CIR. will not pursue a rehab consult for this Pt.   Recommend other rehab venues to be pursued.  Please contact me with any questions.  Kerrilyn Azbill, MS, CCC-SLP Rehab Admissions Coordinator  [phone] (celll) [phone] (office)   

## 2023-04-06 NOTE — TOC Initial Note (Signed)
Transition of Care [facility]) - Initial/Assessment Note    Patient Details  Name: Tracey Sparks MRN: [id number] Date of Birth: Nov 28, 1955  Transition of Care [facility]) CM/SW Contact:    Lorri Frederick, LCSW Phone Number: 04/06/2023, 12:14 PM  Clinical Narrative:     CSW met with pt and husband [name].  Discussed recommendation for CIR but they would prefer SNF and would like [facility] if possible.  Pt from home with husband, permission given to speak with him.  No current home services.  Permission given to send out referral in hub.              Referral sent to [facility].   Expected Discharge Plan: Skilled Nursing Facility Barriers to Discharge: Continued Medical Work up, SNF Pending bed offer   Patient Goals and CMS Choice Patient states their goals for this hospitalization and ongoing recovery are:: walking again   Choice offered to / list presented to : Patient (pt is requesting [facility] for SNF)      Expected Discharge Plan and Services In-house Referral: Clinical Social Work   Post Acute Care Choice: Skilled Nursing Facility Living arrangements for the past 2 months: Single Family Home                                      Prior Living Arrangements/Services Living arrangements for the past 2 months: Single Family Home Lives with:: Spouse   Do you feel safe going back to the place where you live?: Yes      Need for Family Participation in Patient Care: Yes (Comment) Care giver support system in place?: Yes (comment) Current home services: Other (comment) (none) Criminal Activity/Legal Involvement Pertinent to Current Situation/Hospitalization: No - Comment as needed  Activities of Daily Living   ADL Screening (condition at time of admission) Independently performs ADLs?: No Does the patient have a NEW difficulty with bathing/dressing/toileting/self-feeding that is expected to last >3 days?: Yes (Initiates electronic notice to provider for possible OT  consult) Does the patient have a NEW difficulty with getting in/out of bed, walking, or climbing stairs that is expected to last >3 days?: Yes (Initiates electronic notice to provider for possible PT consult) Does the patient have a NEW difficulty with communication that is expected to last >3 days?: No Is the patient deaf or have difficulty hearing?: No Does the patient have difficulty seeing, even when wearing glasses/contacts?: No Does the patient have difficulty concentrating, remembering, or making decisions?: No  Permission Sought/Granted Permission sought to share information with : Family Supports Permission granted to share information with : Yes, Verbal Permission Granted  Share Information with NAME: husband [name]  Permission granted to share info w AGENCY: SNF        Emotional Assessment Appearance:: Appears stated age Attitude/Demeanor/Rapport: Engaged Affect (typically observed): Appropriate, Pleasant Orientation: : Oriented to Self, Oriented to Place, Oriented to  Time, Oriented to Situation      Admission diagnosis:  S/P ORIF (open reduction internal fixation) fracture [N82.956, Z87.81] Postoperative state [Z98.890] Patient Active Problem List   Diagnosis Date Noted   S/P ORIF (open reduction internal fixation) fracture 04/05/2023   Postoperative state 04/05/2023   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   PCP:  Estanislado Pandy, MD Pharmacy:   [facility] [phone] - [place], [place] - [address] AT [facility] [address] [place] [address] Phone:  [phone] Fax: [phone]     Social Determinants of Health (SDOH) Social History: SDOH Screenings   Food Insecurity: No Food Insecurity (04/05/2023)  Housing: Low Risk  (04/05/2023)  Transportation Needs: No Transportation Needs (04/05/2023)  Utilities: Not At Risk (04/05/2023)  Social Connections: Unknown (01/30/2023)   Received from [facility]  Tobacco Use: Low Risk   (04/05/2023)   SDOH Interventions:     Readmission Risk Interventions     No data to display

## 2023-04-06 NOTE — Progress Notes (Signed)
Subjective: 1 Day Post-Op Procedure(s) (LRB): OPEN REDUCTION INTERNAL FIXATION (ORIF) BILATERAL BIMALLEOLAR ANKLE FRACTURE (Bilateral) SYNDESMOSIS REPAIR (Bilateral)  Patient reports pain as appropriately controlled. Denies any new numbness/tingling.   Objective:   VITALS:  Temp:  [98 F (36.7 C)-98.1 F (36.7 C)] 98.1 F (36.7 C) (10/06 1725) Pulse Rate:  [82-90] 82 (10/06 1725) Resp:  [14-16] 16 (10/06 1725) BP: (97-122)/(69-92) 115/71 (10/06 1725) SpO2:  [91 %-99 %] 99 % (10/06 1725) Weight:  [68 kg] 68 kg (10/06 0809)  Gen: AAOx3, NAD  Bilateral lower extremity: Well padded short leg splints in place Wiggles toes SILT over toes CR<2s    LABS Recent Labs    04/05/23 0920 04/05/23 1808  HGB 11.7* 11.3*  WBC 5.2 6.8  PLT 236 237   Recent Labs    04/05/23 1808  CREATININE 0.82   No results for input(s): "LABPT", "INR" in the last 72 hours.   Assessment/Plan: 1 Day Post-Op Procedure(s) (LRB): OPEN REDUCTION INTERNAL FIXATION (ORIF) BILATERAL BIMALLEOLAR ANKLE FRACTURE (Bilateral) SYNDESMOSIS REPAIR (Bilateral)  -admitted to [facility] under Ortho -strict NWB bilateral extremity, maximum elevation -maintain short leg splints until follow up -DVT ppx: Lovenox during admission, Aspirin 81 mg twice daily outpatient while NWB -PT/OT while inpatient -meds sent thru outpatient EMR -Case Management to assist with SNF placement (patient has choices already picked in order) -follow up as outpatient within 7-10 days for wound check with exchange of short leg splint to short leg cast -sutures out in 2-3 weeks in outpatient office  Netta Cedars 04/06/2023, 7:43 AM

## 2023-04-06 NOTE — Evaluation (Signed)
Physical Therapy Evaluation Patient Details Name: Tracey Sparks MRN: [id number] DOB: 02-Nov-1955 Today's Date: 04/06/2023  History of Present Illness  patient is a 67 year old female admitted to [place] for closed bimalleolar fracture of right ankle; Closed trimalleolar fracture of left ankle, ORIF performed 04/05/23  Clinical Impression  Patient received in bed, spouse at bedside. Patient reports moderate pain in B LEs with mobility. She requires cues and min A +1-2 for bed mobility and transfers. She requires cues for NWB on B LEs during mobility. Patient is quite strong and should progress well with mobility. Patient will continue to benefit from skilled PT to improve independence and safety with mobility.            If plan is discharge home, recommend the following: A lot of help with walking and/or transfers;A lot of help with bathing/dressing/bathroom;Help with stairs or ramp for entrance;Assist for transportation;Assistance with cooking/housework   Can travel by private vehicle        Equipment Recommendations None recommended by PT  Recommendations for Other Services       Functional Status Assessment Patient has had a recent decline in their functional status and demonstrates the ability to make significant improvements in function in a reasonable and predictable amount of time.     Precautions / Restrictions Precautions Precautions: Fall Restrictions Weight Bearing Restrictions: Yes RLE Weight Bearing: Non weight bearing LLE Weight Bearing: Non weight bearing Other Position/Activity Restrictions: Keep B LEs elevated      Mobility  Bed Mobility Overal bed mobility: Needs Assistance Bed Mobility: Supine to Sit, Sit to Supine     Supine to sit: Min assist, Used rails Sit to supine: Min assist, Used rails   General bed mobility comments: min A for movement of B LEs due to pain    Transfers Overall transfer level: Needs assistance Equipment used:  None Transfers: Bed to chair/wheelchair/BSC         Anterior-Posterior transfers: Min assist, +2 physical assistance        Ambulation/Gait               General Gait Details: patient is NWB B LEs  Stairs            Wheelchair Mobility     Tilt Bed    Modified Rankin (Stroke Patients Only)       Balance Overall balance assessment: Needs assistance Sitting-balance support: Bilateral upper extremity supported Sitting balance-Leahy Scale: Good                                       Pertinent Vitals/Pain Pain Assessment Pain Assessment: 0-10 Pain Score: 6  Pain Location: B LEs Pain Descriptors / Indicators: Discomfort, Grimacing, Guarding, Sore Pain Intervention(s): Monitored during session, Repositioned    Home Living Family/patient expects to be discharged to:: Private residence Living Arrangements: Spouse/significant other Available Help at Discharge: Family;Available 24 hours/day Type of Home: House Home Access: Ramped entrance       Home Layout: Two level;Able to live on main level with bedroom/bathroom Home Equipment: Wheelchair - manual;BSC/3in1;Shower seat      Prior Function Prior Level of Function : Independent/Modified Independent                     Extremity/Trunk Assessment   Upper Extremity Assessment Upper Extremity Assessment: Defer to OT evaluation    Lower Extremity Assessment Lower  Extremity Assessment: RLE deficits/detail;LLE deficits/detail RLE Deficits / Details: Ankle fracture/s/p ORIF LLE Deficits / Details: Ankle fx. S/P ORIF    Cervical / Trunk Assessment Cervical / Trunk Assessment: Normal  Communication   Communication Communication: No apparent difficulties Cueing Techniques: Verbal cues  Cognition Arousal: Alert Behavior During Therapy: WFL for tasks assessed/performed Overall Cognitive Status: Within Functional Limits for tasks assessed                                           General Comments      Exercises     Assessment/Plan    PT Assessment Patient needs continued PT services  PT Problem List Decreased activity tolerance;Decreased mobility;Pain;Decreased knowledge of use of DME;Decreased safety awareness;Decreased knowledge of precautions       PT Treatment Interventions DME instruction;Functional mobility training;Therapeutic activities;Therapeutic exercise;Balance training;Neuromuscular re-education;Patient/family education;Wheelchair mobility training;Modalities    PT Goals (Current goals can be found in the Care Plan section)  Acute Rehab PT Goals Patient Stated Goal: to regain independence PT Goal Formulation: With patient/family Time For Goal Achievement: 04/20/23 Potential to Achieve Goals: Good    Frequency Min 1X/week     Co-evaluation PT/OT/SLP Co-Evaluation/Treatment: Yes Reason for Co-Treatment: For patient/therapist safety;To address functional/ADL transfers PT goals addressed during session: Mobility/safety with mobility;Balance;Proper use of DME         AM-PAC PT "6 Clicks" Mobility  Outcome Measure Help needed turning from your back to your side while in a flat bed without using bedrails?: A Lot Help needed moving from lying on your back to sitting on the side of a flat bed without using bedrails?: A Lot Help needed moving to and from a bed to a chair (including a wheelchair)?: A Lot Help needed standing up from a chair using your arms (e.g., wheelchair or bedside chair)?: Total Help needed to walk in hospital room?: Total Help needed climbing 3-5 steps with a railing? : Total 6 Click Score: 9    End of Session   Activity Tolerance: Patient tolerated treatment well;Patient limited by pain Patient left: in chair;with family/visitor present Nurse Communication: Mobility status PT Visit Diagnosis: Other abnormalities of gait and mobility (R26.89);Pain;Muscle weakness (generalized) (M62.81);History of  falling (Z91.81) Pain - Right/Left:  (Bilateral)    Time: 8657-8469 PT Time Calculation (min) (ACUTE ONLY): 31 min   Charges:   PT Evaluation $PT Eval Moderate Complexity: 1 Mod   PT General Charges $$ ACUTE PT VISIT: 1 Visit        Gurvir Schrom, PT, GCS 04/06/23,10:27 AM

## 2023-04-07 NOTE — Plan of Care (Signed)

## 2023-04-07 NOTE — Progress Notes (Signed)
Physical Therapy Treatment Patient Details Name: Tracey Sparks MRN: [id number] DOB: 01/26/1956 Today's Date: 04/07/2023   History of Present Illness Pt is a 67 year old female admitted to [facility] for closed bimalleolar fracture of right ankle; Closed trimalleolar fracture of left ankle, ORIF performed 04/05/23. PMH: kidney stones.    PT Comments  Pt progressing well toward goals.  Emphasis on strengthening exercises bil LE's and ant/post transfers with minimal assist when her R hand with IV became to painful to assist.     If plan is discharge home, recommend the following: A lot of help with walking and/or transfers;A lot of help with bathing/dressing/bathroom;Help with stairs or ramp for entrance;Assist for transportation;Assistance with cooking/housework   Can travel by private vehicle        Equipment Recommendations  None recommended by PT    Recommendations for Other Services       Precautions / Restrictions Precautions Precautions: Fall Restrictions Weight Bearing Restrictions: Yes RLE Weight Bearing: Non weight bearing LLE Weight Bearing: Non weight bearing     Mobility  Bed Mobility Overal bed mobility: Needs Assistance Bed Mobility: Supine to Sit     Supine to sit: Contact guard Sit to supine: Contact guard assist        Transfers Overall transfer level: Needs assistance Equipment used: None Transfers: Bed to chair/wheelchair/BSC         Anterior-Posterior transfers: Min assist, +2 safety/equipment   General transfer comment: pt having trouble sustaining posterior scooting/boost due to painful R hand due to IV.  Assisted until she could use arm rest then no further assist..  Feedback cues that the LE's assisted boost at times.    Ambulation/Gait                   Stairs             Wheelchair Mobility     Tilt Bed    Modified Rankin (Stroke Patients Only)       Balance     Sitting balance-Leahy Scale: Fair                                       Cognition Arousal: Alert Behavior During Therapy: WFL for tasks assessed/performed Overall Cognitive Status: Within Functional Limits for tasks assessed                                          Exercises General Exercises - Lower Extremity Heel Slides: AROM, Both, 10 reps, Supine Hip ABduction/ADduction: AROM, Both, 10 reps, Supine Straight Leg Raises: AROM, Both, 10 reps, Supine    General Comments        Pertinent Vitals/Pain Pain Assessment Pain Assessment: Faces Faces Pain Scale: Hurts little more Pain Location: bB LE's and IV site R hand with use. Pain Descriptors / Indicators: Discomfort, Grimacing, Guarding, Sore Pain Intervention(s): Monitored during session    Home Living                          Prior Function            PT Goals (current goals can now be found in the care plan section) Acute Rehab PT Goals PT Goal Formulation: With patient/family Time For Goal Achievement: 04/20/23 Potential to  Achieve Goals: Good Progress towards PT goals: Progressing toward goals    Frequency    Min 1X/week      PT Plan      Co-evaluation              AM-PAC PT "6 Clicks" Mobility   Outcome Measure  Help needed turning from your back to your side while in a flat bed without using bedrails?: A Little Help needed moving from lying on your back to sitting on the side of a flat bed without using bedrails?: A Little Help needed moving to and from a bed to a chair (including a wheelchair)?: A Lot Help needed standing up from a chair using your arms (e.g., wheelchair or bedside chair)?: Total Help needed to walk in hospital room?: Total Help needed climbing 3-5 steps with a railing? : Total 6 Click Score: 11    End of Session   Activity Tolerance: Patient tolerated treatment well;Patient limited by pain Patient left: in chair;with family/visitor present Nurse Communication: Mobility  status PT Visit Diagnosis: Other abnormalities of gait and mobility (R26.89);Pain;Muscle weakness (generalized) (M62.81);History of falling (Z91.81) Pain - part of body: Ankle and joints of foot (bil)     Time: 7829-5621 PT Time Calculation (min) (ACUTE ONLY): 24 min  Charges:    $Therapeutic Exercise: 8-22 mins $Therapeutic Activity: 8-22 mins PT General Charges $$ ACUTE PT VISIT: 1 Visit                     04/07/2023  Jacinto Halim., PT Acute Rehabilitation Services [phone]  (office)   Tracey Sparks Tracey Sparks 04/07/2023, 12:17 PM

## 2023-04-07 NOTE — Progress Notes (Signed)
Subjective: 2 Days Post-Op Procedure(s) (LRB): OPEN REDUCTION INTERNAL FIXATION (ORIF) BILATERAL BIMALLEOLAR ANKLE FRACTURE (Bilateral) SYNDESMOSIS REPAIR (Bilateral)  Patient reports pain as appropriately controlled. Denies any new numbness/tingling.   Objective:   VITALS:  Temp:  [98 F (36.7 C)-98.6 F (37 C)] 98 F (36.7 C) (10/08 0722) Pulse Rate:  [76-86] 85 (10/08 0722) Resp:  [18-19] 18 (10/08 0422) BP: (101-131)/(52-72) 130/71 (10/08 0722) SpO2:  [91 %-99 %] 97 % (10/08 0722)  Gen: AAOx3, NAD  Bilateral lower extremity: Well padded short leg splints in place Wiggles toes SILT over toes CR<2s    LABS Recent Labs    04/05/23 0920 04/05/23 1808  HGB 11.7* 11.3*  WBC 5.2 6.8  PLT 236 237   Recent Labs    04/05/23 1808  CREATININE 0.82   No results for input(s): "LABPT", "INR" in the last 72 hours.   Assessment/Plan: 2 Days Post-Op Procedure(s) (LRB): OPEN REDUCTION INTERNAL FIXATION (ORIF) BILATERAL BIMALLEOLAR ANKLE FRACTURE (Bilateral) SYNDESMOSIS REPAIR (Bilateral)  -admitted to [facility] under Ortho -strict NWB bilateral extremity, maximum elevation -maintain short leg splints until follow up -DVT ppx: Lovenox during admission, Aspirin 81 mg twice daily outpatient while NWB -PT/OT while inpatient -meds sent thru outpatient EMR -Case Management to assist with SNF placement (patient has choices already picked in order) -follow up as outpatient within 7-10 days for wound check with exchange of short leg splint to short leg cast -sutures out in 2-3 weeks in outpatient office  Netta Cedars 04/07/2023, 1:29 PM

## 2023-04-07 NOTE — TOC Progression Note (Addendum)
Transition of Care [facility]) - Progression Note    Patient Details  Name: Tracey Sparks MRN: [id number] Date of Birth: 1955-10-22  Transition of Care [facility]) CM/SW Contact  Lorri Frederick, LCSW Phone Number: 04/07/2023, 10:17 AM  Clinical Narrative:   CSW spoke with [facility].  They can offer bed but will not have bed available until tomorrow.  1150: CSW updated pt, plan for [facility]  Auth request submitted in [place] and approved: [id number], 3 days: 10/9-10/11.  Expected Discharge Plan: Skilled Nursing Facility Barriers to Discharge: Continued Medical Work up, SNF Pending bed offer  Expected Discharge Plan and Services In-house Referral: Clinical Social Work   Post Acute Care Choice: Skilled Nursing Facility Living arrangements for the past 2 months: Single Family Home                                       Social Determinants of Health (SDOH) Interventions SDOH Screenings   Food Insecurity: No Food Insecurity (04/05/2023)  Housing: Low Risk  (04/05/2023)  Transportation Needs: No Transportation Needs (04/05/2023)  Utilities: Not At Risk (04/05/2023)  Social Connections: Unknown (01/30/2023)   Received from [facility]  Tobacco Use: Low Risk  (04/05/2023)    Readmission Risk Interventions     No data to display

## 2023-04-08 ENCOUNTER — Encounter (HOSPITAL_COMMUNITY): Payer: Self-pay | Admitting: Orthopaedic Surgery

## 2023-04-08 DIAGNOSIS — Z4889 Encounter for other specified surgical aftercare: Secondary | ICD-10-CM | POA: Diagnosis not present

## 2023-04-08 DIAGNOSIS — R69 Illness, unspecified: Secondary | ICD-10-CM | POA: Diagnosis not present

## 2023-04-08 DIAGNOSIS — S82845D Nondisplaced bimalleolar fracture of left lower leg, subsequent encounter for closed fracture with routine healing: Secondary | ICD-10-CM | POA: Diagnosis not present

## 2023-04-08 DIAGNOSIS — S93402D Sprain of unspecified ligament of left ankle, subsequent encounter: Secondary | ICD-10-CM | POA: Diagnosis not present

## 2023-04-08 DIAGNOSIS — S93401D Sprain of unspecified ligament of right ankle, subsequent encounter: Secondary | ICD-10-CM | POA: Diagnosis not present

## 2023-04-08 DIAGNOSIS — S82844D Nondisplaced bimalleolar fracture of right lower leg, subsequent encounter for closed fracture with routine healing: Secondary | ICD-10-CM | POA: Diagnosis not present

## 2023-04-08 DIAGNOSIS — Z743 Need for continuous supervision: Secondary | ICD-10-CM | POA: Diagnosis not present

## 2023-04-08 DIAGNOSIS — M6281 Muscle weakness (generalized): Secondary | ICD-10-CM | POA: Diagnosis not present

## 2023-04-08 DIAGNOSIS — D649 Anemia, unspecified: Secondary | ICD-10-CM | POA: Diagnosis not present

## 2023-04-08 DIAGNOSIS — R262 Difficulty in walking, not elsewhere classified: Secondary | ICD-10-CM | POA: Diagnosis not present

## 2023-04-08 DIAGNOSIS — R531 Weakness: Secondary | ICD-10-CM | POA: Diagnosis not present

## 2023-04-08 DIAGNOSIS — S82841D Displaced bimalleolar fracture of right lower leg, subsequent encounter for closed fracture with routine healing: Secondary | ICD-10-CM | POA: Diagnosis not present

## 2023-04-08 DIAGNOSIS — Z4789 Encounter for other orthopedic aftercare: Secondary | ICD-10-CM | POA: Diagnosis not present

## 2023-04-08 DIAGNOSIS — Z9889 Other specified postprocedural states: Secondary | ICD-10-CM | POA: Diagnosis not present

## 2023-04-08 DIAGNOSIS — S82851D Displaced trimalleolar fracture of right lower leg, subsequent encounter for closed fracture with routine healing: Secondary | ICD-10-CM | POA: Diagnosis not present

## 2023-04-08 DIAGNOSIS — R0981 Nasal congestion: Secondary | ICD-10-CM | POA: Diagnosis not present

## 2023-04-08 DIAGNOSIS — Z8781 Personal history of (healed) traumatic fracture: Secondary | ICD-10-CM | POA: Diagnosis not present

## 2023-04-08 MED ORDER — BISACODYL 10 MG RE SUPP: 10.0000 mg | Freq: Every day | RECTAL | Status: DC | PRN

## 2023-04-08 NOTE — Care Management Important Message (Signed)
Important Message  Patient Details  Name: Tracey Sparks MRN: [id number] Date of Birth: 22-Dec-1955   Important Message Given:  Yes - Medicare IM     Sherilyn Banker 04/08/2023, 4:21 PM

## 2023-04-08 NOTE — Progress Notes (Signed)
Occupational Therapy Treatment Patient Details Name: Tracey Sparks MRN: [id number] DOB: 01/01/1956 Today's Date: 04/08/2023   History of present illness Pt is a 67 year old female admitted to [facility] for closed bimalleolar fracture of right ankle; Closed trimalleolar fracture of left ankle, ORIF performed 04/05/23. PMH: kidney stones.   OT comments  Patient in recliner, agreeable to OT. Worked on Anterior posterior transfer back to bed then onto [facility].  Good adherence to NWB to BLEs, cueing for safety onto [facility]. Updated dc plan to >3hrs/day inpatient setting.       If plan is discharge home, recommend the following:  A lot of help with bathing/dressing/bathroom;Two people to help with walking and/or transfers;Assistance with cooking/housework;Assist for transportation;Help with stairs or ramp for entrance   Equipment Recommendations  Other (comment) (TBD at next venue of care)    Recommendations for Other Services      Precautions / Restrictions Precautions Precautions: Fall Restrictions Weight Bearing Restrictions: Yes RLE Weight Bearing: Non weight bearing LLE Weight Bearing: Non weight bearing Other Position/Activity Restrictions: Keep B LEs elevated       Mobility Bed Mobility               General bed mobility comments: in recliner upon entry    Transfers Overall transfer level: Needs assistance Equipment used: None Transfers: Bed to chair/wheelchair/BSC         Anterior-Posterior transfers: Min assist   General transfer comment: cueing for safety and technique onto [facility]     Balance Overall balance assessment: Needs assistance Sitting-balance support: Bilateral upper extremity supported, No upper extremity supported Sitting balance-Leahy Scale: Fair                                     ADL either performed or assessed with clinical judgement   ADL Overall ADL's : Needs assistance/impaired     Grooming: Set up;Sitting                    Toilet Transfer: Minimal assistance;BSC/3in1;Anterior/posterior           Functional mobility during ADLs: Minimal assistance General ADL Comments: a/p transfer from recliner to bed, bed to [facility]    Extremity/Trunk Assessment              Vision       Perception     Praxis      Cognition Arousal: Alert Behavior During Therapy: WFL for tasks assessed/performed Overall Cognitive Status: Within Functional Limits for tasks assessed                                          Exercises      Shoulder Instructions       General Comments spouse present and supportive    Pertinent Vitals/ Pain       Pain Assessment Pain Assessment: Faces Faces Pain Scale: Hurts a little bit Pain Location: BLEs Pain Descriptors / Indicators: Discomfort, Grimacing, Guarding, Sore Pain Intervention(s): Limited activity within patient's tolerance, Monitored during session, Repositioned  Home Living                                          Prior Functioning/Environment  Frequency  Min 1X/week        Progress Toward Goals  OT Goals(current goals can now be found in the care plan section)  Progress towards OT goals: Progressing toward goals  Acute Rehab OT Goals Patient Stated Goal: rehab OT Goal Formulation: With patient Time For Goal Achievement: 04/20/23 Potential to Achieve Goals: Good  Plan      Co-evaluation                 AM-PAC OT "6 Clicks" Daily Activity     Outcome Measure   Help from another person eating meals?: None Help from another person taking care of personal grooming?: A Little Help from another person toileting, which includes using toliet, bedpan, or urinal?: A Lot Help from another person bathing (including washing, rinsing, drying)?: A Little Help from another person to put on and taking off regular upper body clothing?: A Little Help from another person to put on and taking off  regular lower body clothing?: A Lot 6 Click Score: 17    End of Session    OT Visit Diagnosis: Other abnormalities of gait and mobility (R26.89);Pain Pain - part of body: Ankle and joints of foot (bil)   Activity Tolerance Patient tolerated treatment well   Patient Left with call bell/phone within reach;with family/visitor present (on [facility])   Nurse Communication Mobility status        Time: 4098-1191 OT Time Calculation (min): 12 min  Charges: OT General Charges $OT Visit: 1 Visit OT Treatments $Self Care/Home Management : 8-22 mins  Barry Brunner, OT Acute Rehabilitation Services Office [phone]   Chancy Milroy 04/08/2023, 1:50 PM

## 2023-04-08 NOTE — TOC Progression Note (Signed)
Transition of Care [facility]) - Progression Note    Patient Details  Name: Tracey Sparks MRN: [id number] Date of Birth: 07/28/1955  Transition of Care [facility]) CM/SW Contact  Lorri Frederick, LCSW Phone Number: 04/08/2023, 10:45 AM  Clinical Narrative:   CS[facility] confirmed with [facility] that [facility] can receive pt today.  Message left with Dr Odis Hollingshead office regarding potential DC today.    Expected Discharge Plan: Skilled Nursing Facility Barriers to Discharge: Continued Medical Work up, SNF Pending bed offer  Expected Discharge Plan and Services In-house Referral: Clinical Social Work   Post Acute Care Choice: Skilled Nursing Facility Living arrangements for the past 2 months: Single Family Home                                       Social Determinants of Health (SDOH) Interventions SDOH Screenings   Food Insecurity: No Food Insecurity (04/05/2023)  Housing: Low Risk  (04/05/2023)  Transportation Needs: No Transportation Needs (04/05/2023)  Utilities: Not At Risk (04/05/2023)  Social Connections: Unknown (01/30/2023)   Received from [facility]  Tobacco Use: Low Risk  (04/05/2023)    Readmission Risk Interventions     No data to display

## 2023-04-08 NOTE — TOC Transition Note (Signed)
Transition of Care [facility]) - CM/SW Discharge Note   Patient Details  Name: Tracey Sparks MRN: [id number] Date of Birth: 1955/08/11  Transition of Care [facility]) CM/SW Contact:  Lorri Frederick, LCSW Phone Number: 04/08/2023, 2:04 PM   Clinical Narrative:   Pt discharging to [name]. RN call report to [phone]    Final next level of care: Skilled Nursing Facility Barriers to Discharge: Barriers Resolved   Patient Goals and CMS Choice   Choice offered to / list presented to : Patient (pt is requesting [facility] for SNF)  Discharge Placement                Patient chooses bed at:  [name]) Patient to be transferred to facility by: ptar Name of family member notified: husband [name] in room Patient and family notified of of transfer: 04/08/23  Discharge Plan and Services Additional resources added to the After Visit Summary for   In-house Referral: Clinical Social Work   Post Acute Care Choice: Skilled Nursing Facility                               Social Determinants of Health (SDOH) Interventions SDOH Screenings   Food Insecurity: No Food Insecurity (04/05/2023)  Housing: Low Risk  (04/05/2023)  Transportation Needs: No Transportation Needs (04/05/2023)  Utilities: Not At Risk (04/05/2023)  Social Connections: Unknown (01/30/2023)   Received from [facility]  Tobacco Use: Low Risk  (04/05/2023)     Readmission Risk Interventions     No data to display

## 2023-04-08 NOTE — Discharge Summary (Signed)
Physician Discharge Summary  Patient ID: Tracey Sparks MRN: [id number] DOB/AGE: 1956/01/12 67 y.o.  Admit date: 04/05/2023 Discharge date: 04/08/2023  Admission Diagnoses: Bilateral ankle fractures  Discharge Diagnoses:  Principal Problem:   S/P ORIF (open reduction internal fixation) fracture Active Problems:   Postoperative state   Discharged Condition: good  Hospital Course: The patient sustained bilateral ankle fractures while on a cruise. She was seen in outpatient clinic. She went to [facility] for bilateral ankle surgery on 04/05/23. Underwent surgery successfully without complications. She was admitted for assistance with pain control and ambulation. She was skilled by PT/OT for SNF. She was accepted for SNF once bed available and discharged on 04/08/23 without complications.  Consults: None  Significant Diagnostic Studies: n/a  Treatments: surgery: bilateral ankle ORIF  Discharge Exam: Blood pressure 110/62, pulse 74, temperature (!) 97.5 F (36.4 C), temperature source Oral, resp. rate 18, height 5\' 2"  (1.575 m), weight 68 kg, SpO2 97%. Gen: AAOx3, NAD  Bilateral lower extremity: Well padded short leg splint in place Wiggles toes SILT over toes CR<2s   Disposition:    Allergies as of 04/08/2023   No Known Allergies      Medication List     TAKE these medications    acetaminophen 500 MG tablet Commonly known as: TYLENOL Take 1,000 mg by mouth every 6 (six) hours as needed for mild pain.   aspirin EC 81 MG tablet Take 81 mg by mouth in the morning and at bedtime. Swallow whole.   calcium carbonate 1500 (600 Ca) MG Tabs tablet Commonly known as: OSCAL Take 1,200 mg by mouth in the morning.   oxyCODONE 5 MG immediate release tablet Commonly known as: Oxy IR/ROXICODONE Take 5 mg by mouth every 4 (four) hours as needed for severe pain.   VITAMIN D3 PO Take 5,000 Units by mouth daily.        Contact information for after-discharge care      Destination     [facility] Preferred SNF .   Service: Skilled Nursing Contact information: [address] [place] [address] [phone]                     Signed: Netta Cedars 04/08/2023, 1:34 PM

## 2023-04-08 NOTE — Progress Notes (Signed)
Subjective: 3 Days Post-Op Procedure(s) (LRB): OPEN REDUCTION INTERNAL FIXATION (ORIF) BILATERAL BIMALLEOLAR ANKLE FRACTURE (Bilateral) SYNDESMOSIS REPAIR (Bilateral)  Patient reports pain as appropriately controlled. Denies any new numbness/tingling.   Objective:   VITALS:  Temp:  [97.5 F (36.4 C)-98.6 F (37 C)] 97.5 F (36.4 C) (10/09 0726) Pulse Rate:  [74-96] 74 (10/09 0726) Resp:  [18] 18 (10/09 0726) BP: (109-122)/(62-70) 110/62 (10/09 0726) SpO2:  [93 %-99 %] 97 % (10/09 0726)  Gen: AAOx3, NAD  Bilateral lower extremity: Well padded short leg splints in place Wiggles toes SILT over toes CR<2s    LABS Recent Labs    04/05/23 1808  HGB 11.3*  WBC 6.8  PLT 237   Recent Labs    04/05/23 1808  CREATININE 0.82   No results for input(s): "LABPT", "INR" in the last 72 hours.   Assessment/Plan: 3 Days Post-Op Procedure(s) (LRB): OPEN REDUCTION INTERNAL FIXATION (ORIF) BILATERAL BIMALLEOLAR ANKLE FRACTURE (Bilateral) SYNDESMOSIS REPAIR (Bilateral)  -admitted to RNF under Ortho - DC to SNF today -strict NWB bilateral extremity, maximum elevation -maintain short leg splints until follow up -DVT ppx: Lovenox during admission, Aspirin 81 mg twice daily outpatient while NWB -PT/OT while inpatient -meds sent thru outpatient EMR -Case Management to assist with SNF placement (patient has choices already picked in order) -follow up as outpatient within 7-10 days for wound check with exchange of short leg splint to short leg cast -sutures out in 2-3 weeks in outpatient office  Netta Cedars 04/08/2023, 1:33 PM

## 2023-04-09 ENCOUNTER — Encounter: Payer: Self-pay | Admitting: Adult Health

## 2023-04-09 ENCOUNTER — Other Ambulatory Visit: Payer: Self-pay | Admitting: Adult Health

## 2023-04-09 ENCOUNTER — Non-Acute Institutional Stay (SKILLED_NURSING_FACILITY): Payer: Medicare Other | Admitting: Adult Health

## 2023-04-09 DIAGNOSIS — S82843S Displaced bimalleolar fracture of unspecified lower leg, sequela: Secondary | ICD-10-CM

## 2023-04-09 DIAGNOSIS — S82844D Nondisplaced bimalleolar fracture of right lower leg, subsequent encounter for closed fracture with routine healing: Secondary | ICD-10-CM | POA: Diagnosis not present

## 2023-04-09 DIAGNOSIS — S82845D Nondisplaced bimalleolar fracture of left lower leg, subsequent encounter for closed fracture with routine healing: Secondary | ICD-10-CM

## 2023-04-09 DIAGNOSIS — Z8781 Personal history of (healed) traumatic fracture: Secondary | ICD-10-CM

## 2023-04-09 DIAGNOSIS — Z9889 Other specified postprocedural states: Secondary | ICD-10-CM | POA: Diagnosis not present

## 2023-04-09 MED ORDER — OXYCODONE HCL 5 MG PO TABS: 5.0000 mg | ORAL_TABLET | ORAL | 0 refills | Status: DC | PRN

## 2023-04-09 NOTE — Progress Notes (Signed)
Location:  [facility] Room Number: 133 Place of Service:  SNF (31)   CODE STATUS: full   No Known Allergies  Chief Complaint  Patient presents with   Hospitalization Follow-up    HPI:  She is a 67 year old woman who has been hospitalized from 04-05-23 through 04-08-23. Her medical history includes: history of kidney stones. She had a fall while on a cruise and suffered bilateral bimalleolar fractures and underwent ORIF on 04-05-23. She is here for short term rehab with her goal to return back home. Her pain is presently being managed.   Past Medical History:  Diagnosis Date   History of kidney stones     Past Surgical History:  Procedure Laterality Date   ABDOMINAL HYSTERECTOMY     CESAREAN SECTION     CYSTOSCOPY W/ RETROGRADES Right 06/17/2016   Procedure: CYSTOSCOPY WITH RETROGRADE PYELOGRAM;  Surgeon: Marcine Matar, MD;  Location: [facility];  Service: Urology;  Laterality: Right;   CYSTOSCOPY W/ URETERAL STENT PLACEMENT Right 09/08/2016   Procedure: CYSTOSCOPY WITH RETROGRADE PYELOGRAM/URETERAL STENT PLACEMENT;  Surgeon: Heloise Purpura, MD;  Location: [facility];  Service: Urology;  Laterality: Right;   CYSTOSCOPY WITH STENT PLACEMENT Right 06/17/2016   Procedure: CYSTOSCOPY WITH STENT PLACEMENT;  Surgeon: Marcine Matar, MD;  Location: [facility];  Service: Urology;  Laterality: Right;   LAPAROSCOPIC BILATERAL SALPINGO OOPHERECTOMY Bilateral    ORIF ANKLE FRACTURE Bilateral 04/05/2023   Procedure: OPEN REDUCTION INTERNAL FIXATION (ORIF) BILATERAL BIMALLEOLAR ANKLE FRACTURE;  Surgeon: Netta Cedars, MD;  Location: [facility];  Service: Orthopedics;  Laterality: Bilateral;  GENERAL AND REGIONAL   ROBOT ASSISTED PYELOPLASTY Right 09/08/2016   Procedure: ROBOTIC ASSISTED LAPAROSCOPIC PYELOPLASTY;  Surgeon: Heloise Purpura, MD;  Location: [facility];  Service: Urology;  Laterality: Right;   SYNDESMOSIS REPAIR Bilateral 04/05/2023   Procedure: SYNDESMOSIS REPAIR;   Surgeon: Netta Cedars, MD;  Location: [facility];  Service: Orthopedics;  Laterality: Bilateral;    Social History   Socioeconomic History   Marital status: Married    Spouse name: Not on file   Number of children: Not on file   Years of education: Not on file   Highest education level: Not on file  Occupational History   Not on file  Tobacco Use   Smoking status: Never   Smokeless tobacco: Never  Substance and Sexual Activity   Alcohol use: No   Drug use: No   Sexual activity: Yes    Birth control/protection: Surgical  Other Topics Concern   Not on file  Social History Narrative   Not on file   Social Determinants of Health   Financial Resource Strain: Not on file  Food Insecurity: No Food Insecurity (04/05/2023)   Hunger Vital Sign    Worried About Running Out of Food in the Last Year: Never true    Ran Out of Food in the Last Year: Never true  Transportation Needs: No Transportation Needs (04/05/2023)   PRAPARE - Administrator, Civil Service (Medical): No    Lack of Transportation (Non-Medical): No  Physical Activity: Not on file  Stress: Not on file  Social Connections: Unknown (01/30/2023)   Received from [facility]   Social Network    Social Network: Not on file  Intimate Partner Violence: Not At Risk (04/05/2023)   Humiliation, Afraid, Rape, and Kick questionnaire    Fear of Current or Ex-Partner: No    Emotionally Abused: No    Physically Abused: No  Sexually Abused: No   No family history on file.    VITAL SIGNS BP (!) 113/56   Pulse 88   Temp 98.9 F (37.2 C)   Resp 20   Ht 5\' 2"  (1.575 m)   Wt 150 lb (68 kg)   SpO2 92%   BMI 27.44 kg/m   Outpatient Encounter Medications as of 04/09/2023  Medication Sig Note   acetaminophen (TYLENOL) 500 MG tablet Take 1,000 mg by mouth every 6 (six) hours as needed for mild pain.    aspirin EC 81 MG tablet Take 81 mg by mouth in the morning and at bedtime. Swallow whole. 04/03/2023: For POST  OP use only   calcium carbonate (OSCAL) 1500 (600 Ca) MG TABS tablet Take 1,200 mg by mouth in the morning.    Cholecalciferol (VITAMIN D3 PO) Take 5,000 Units by mouth daily.    oxyCODONE (OXY IR/ROXICODONE) 5 MG immediate release tablet Take 1 tablet (5 mg total) by mouth every 4 (four) hours as needed for severe pain.    No facility-administered encounter medications on file as of 04/09/2023.     SIGNIFICANT DIAGNOSTIC EXAMS  Review of Systems  Constitutional:  Negative for malaise/fatigue.  Respiratory:  Negative for cough and shortness of breath.   Cardiovascular:  Negative for chest pain, palpitations and leg swelling.  Gastrointestinal:  Negative for abdominal pain, constipation and heartburn.  Musculoskeletal:  Positive for joint pain. Negative for back pain and myalgias.  Skin: Negative.   Neurological:  Negative for dizziness.  Psychiatric/Behavioral:  The patient is not nervous/anxious.    Physical Exam Constitutional:      General: She is not in acute distress.    Appearance: She is well-developed. She is not diaphoretic.  Neck:     Thyroid: No thyromegaly.  Cardiovascular:     Rate and Rhythm: Normal rate and regular rhythm.     Pulses: Normal pulses.     Heart sounds: Normal heart sounds.  Pulmonary:     Effort: Pulmonary effort is normal. No respiratory distress.     Breath sounds: Normal breath sounds.  Abdominal:     General: Bowel sounds are normal. There is no distension.     Palpations: Abdomen is soft.     Tenderness: There is no abdominal tenderness.  Musculoskeletal:     Cervical back: Neck supple.     Right lower leg: No edema.     Left lower leg: No edema.     Comments: Bilateral lower extremity is half casts   Lymphadenopathy:     Cervical: No cervical adenopathy.  Skin:    General: Skin is warm and dry.  Neurological:     Mental Status: She is alert and oriented to person, place, and time.  Psychiatric:        Mood and Affect: Mood normal.        ASSESSMENT/ PLAN:  TODAY  Bimalleolar fracture unspecified laterality sequela S/p ORIF ankles.   Will continue asa 81 mg twice daily; has prn oxycodone at this time The goal of her care is to return back home    Synthia Innocent NP [facility]  call [phone]

## 2023-04-13 ENCOUNTER — Non-Acute Institutional Stay (SKILLED_NURSING_FACILITY): Payer: Medicare Other | Admitting: Adult Health

## 2023-04-13 ENCOUNTER — Encounter: Payer: Self-pay | Admitting: Adult Health

## 2023-04-13 DIAGNOSIS — S82845D Nondisplaced bimalleolar fracture of left lower leg, subsequent encounter for closed fracture with routine healing: Secondary | ICD-10-CM

## 2023-04-13 DIAGNOSIS — S82843S Displaced bimalleolar fracture of unspecified lower leg, sequela: Secondary | ICD-10-CM | POA: Insufficient documentation

## 2023-04-13 DIAGNOSIS — R0981 Nasal congestion: Secondary | ICD-10-CM | POA: Diagnosis not present

## 2023-04-13 DIAGNOSIS — Z4889 Encounter for other specified surgical aftercare: Secondary | ICD-10-CM | POA: Diagnosis not present

## 2023-04-13 DIAGNOSIS — S82844D Nondisplaced bimalleolar fracture of right lower leg, subsequent encounter for closed fracture with routine healing: Secondary | ICD-10-CM | POA: Diagnosis not present

## 2023-04-13 NOTE — Progress Notes (Unsigned)
Location:  [facility] Room Number: South/133/P Place of Service:  SNF (31)   CODE STATUS: Full Code  No Known Allergies  Chief Complaint  Patient presents with   Acute Visit    Sinus congestion    HPI:  She is complaining of increased sinus congestion. She states that this happens to her every year at this time and she takes sudafed with relief. She is presently off oxycodone and states she feels better off that medication. She is presently taking tylenol cr 650 mg every 6 hours routinely.   Past Medical History:  Diagnosis Date   History of kidney stones     Past Surgical History:  Procedure Laterality Date   ABDOMINAL HYSTERECTOMY     CESAREAN SECTION     CYSTOSCOPY W/ RETROGRADES Right 06/17/2016   Procedure: CYSTOSCOPY WITH RETROGRADE PYELOGRAM;  Surgeon: Marcine Matar, MD;  Location: [facility];  Service: Urology;  Laterality: Right;   CYSTOSCOPY W/ URETERAL STENT PLACEMENT Right 09/08/2016   Procedure: CYSTOSCOPY WITH RETROGRADE PYELOGRAM/URETERAL STENT PLACEMENT;  Surgeon: Heloise Purpura, MD;  Location: [facility];  Service: Urology;  Laterality: Right;   CYSTOSCOPY WITH STENT PLACEMENT Right 06/17/2016   Procedure: CYSTOSCOPY WITH STENT PLACEMENT;  Surgeon: Marcine Matar, MD;  Location: [facility];  Service: Urology;  Laterality: Right;   LAPAROSCOPIC BILATERAL SALPINGO OOPHERECTOMY Bilateral    ORIF ANKLE FRACTURE Bilateral 04/05/2023   Procedure: OPEN REDUCTION INTERNAL FIXATION (ORIF) BILATERAL BIMALLEOLAR ANKLE FRACTURE;  Surgeon: Netta Cedars, MD;  Location: [facility];  Service: Orthopedics;  Laterality: Bilateral;  GENERAL AND REGIONAL   ROBOT ASSISTED PYELOPLASTY Right 09/08/2016   Procedure: ROBOTIC ASSISTED LAPAROSCOPIC PYELOPLASTY;  Surgeon: Heloise Purpura, MD;  Location: [facility];  Service: Urology;  Laterality: Right;   SYNDESMOSIS REPAIR Bilateral 04/05/2023   Procedure: SYNDESMOSIS REPAIR;  Surgeon: Netta Cedars, MD;  Location: [facility];   Service: Orthopedics;  Laterality: Bilateral;    Social History   Socioeconomic History   Marital status: Married    Spouse name: Not on file   Number of children: Not on file   Years of education: Not on file   Highest education level: Not on file  Occupational History   Not on file  Tobacco Use   Smoking status: Never   Smokeless tobacco: Never  Substance and Sexual Activity   Alcohol use: No   Drug use: No   Sexual activity: Yes    Birth control/protection: Surgical  Other Topics Concern   Not on file  Social History Narrative   Not on file   Social Determinants of Health   Financial Resource Strain: Not on file  Food Insecurity: No Food Insecurity (04/05/2023)   Hunger Vital Sign    Worried About Running Out of Food in the Last Year: Never true    Ran Out of Food in the Last Year: Never true  Transportation Needs: No Transportation Needs (04/05/2023)   PRAPARE - Administrator, Civil Service (Medical): No    Lack of Transportation (Non-Medical): No  Physical Activity: Not on file  Stress: Not on file  Social Connections: Unknown (01/30/2023)   Received from [facility]   Social Network    Social Network: Not on file  Intimate Partner Violence: Not At Risk (04/05/2023)   Humiliation, Afraid, Rape, and Kick questionnaire    Fear of Current or Ex-Partner: No    Emotionally Abused: No    Physically Abused: No    Sexually Abused: No  History reviewed. No pertinent family history.    VITAL SIGNS BP (!) 105/58   Pulse 79   Temp (!) 96.8 F (36 C)   Resp 20   Ht 5\' 2"  (1.575 m)   Wt 160 lb 1.6 oz (72.6 kg)   SpO2 99%   BMI 29.28 kg/m   Outpatient Encounter Medications as of 04/13/2023  Medication Sig   acetaminophen (TYLENOL) 650 MG CR tablet Take 650 mg by mouth every 6 (six) hours.   aspirin EC 81 MG tablet Take 81 mg by mouth in the morning and at bedtime. Swallow whole.   Cholecalciferol (VITAMIN D3 PO) Take 5,000 Units by mouth daily.    sennosides-docusate sodium (SENOKOT-S) 8.6-50 MG tablet Take 1 tablet by mouth daily.   [DISCONTINUED] acetaminophen (TYLENOL) 500 MG tablet Take 1,000 mg by mouth every 6 (six) hours as needed for mild pain.   [DISCONTINUED] calcium carbonate (OSCAL) 1500 (600 Ca) MG TABS tablet Take 1,200 mg by mouth in the morning.   No facility-administered encounter medications on file as of 04/13/2023.     SIGNIFICANT DIAGNOSTIC EXAMS  Review of Systems  Constitutional:  Negative for malaise/fatigue.  HENT:  Positive for congestion.   Respiratory:  Negative for cough and shortness of breath.   Cardiovascular:  Negative for chest pain, palpitations and leg swelling.  Gastrointestinal:  Negative for abdominal pain, constipation and heartburn.  Musculoskeletal:  Negative for back pain, joint pain and myalgias.  Skin: Negative.   Neurological:  Negative for dizziness.  Psychiatric/Behavioral:  The patient is not nervous/anxious.    Physical Exam Constitutional:      General: She is not in acute distress.    Appearance: She is well-developed. She is not diaphoretic.  Neck:     Thyroid: No thyromegaly.  Cardiovascular:     Rate and Rhythm: Normal rate and regular rhythm.     Heart sounds: Normal heart sounds.  Pulmonary:     Effort: Pulmonary effort is normal. No respiratory distress.     Breath sounds: Normal breath sounds.  Abdominal:     General: Bowel sounds are normal. There is no distension.     Palpations: Abdomen is soft.     Tenderness: There is no abdominal tenderness.  Musculoskeletal:     Cervical back: Neck supple.     Right lower leg: No edema.     Left lower leg: No edema.     Comments: Bilateral lower extremity is half casts    Lymphadenopathy:     Cervical: No cervical adenopathy.  Skin:    General: Skin is warm and dry.  Neurological:     Mental Status: She is alert and oriented to person, place, and time.       ASSESSMENT/ PLAN:  TODAY  Bimalleolar  fracture  unspecified laterality sequela Sinus congestion  Will continue tylenol cr 650 mg every 6 hours for pain management Will begin sudafed 30 mg every 8 hours as needed    Synthia Innocent NP [facility]  call [phone]

## 2023-04-14 ENCOUNTER — Encounter: Payer: Self-pay | Admitting: Internal Medicine

## 2023-04-14 ENCOUNTER — Non-Acute Institutional Stay (SKILLED_NURSING_FACILITY): Payer: Medicare Other | Admitting: Internal Medicine

## 2023-04-14 DIAGNOSIS — J3089 Other allergic rhinitis: Secondary | ICD-10-CM | POA: Diagnosis not present

## 2023-04-14 DIAGNOSIS — R69 Illness, unspecified: Secondary | ICD-10-CM | POA: Diagnosis not present

## 2023-04-14 DIAGNOSIS — S82844D Nondisplaced bimalleolar fracture of right lower leg, subsequent encounter for closed fracture with routine healing: Secondary | ICD-10-CM

## 2023-04-14 DIAGNOSIS — S82843S Displaced bimalleolar fracture of unspecified lower leg, sequela: Secondary | ICD-10-CM

## 2023-04-14 DIAGNOSIS — S82845D Nondisplaced bimalleolar fracture of left lower leg, subsequent encounter for closed fracture with routine healing: Secondary | ICD-10-CM

## 2023-04-14 DIAGNOSIS — D649 Anemia, unspecified: Secondary | ICD-10-CM

## 2023-04-14 NOTE — Progress Notes (Unsigned)
NURSING HOME LOCATION:  [facility] ROOM NUMBER:  133 P  CODE STATUS:  Full Code  PCP:  Fara Chute MD  This is a comprehensive admission note to this SNFperformed on this date less than 30 days from date of admission. Included are preadmission medical/surgical history; reconciled medication list; family history; social history and comprehensive review of systems.  Corrections and additions to the records were documented. Comprehensive physical exam was also performed. Additionally a clinical summary was entered for each active diagnosis pertinent to this admission in the Problem List to enhance continuity of care.  HPI: She was hospitalized 10/6-10/03/2023 with bilateral ankle fractures for which bilateral ORIF (open reduction internal fixation) was completed.  Fractures had occurred while on a cruise.  She was on a [place] cruise with temperatures in the upper 80s and 100% humidity.  She stood up to ambulate and felt slightly dizzy and then noticed her visual field turning white.  She fell; her husband was able to catch her but she sustained the injuries.  Air casts were applied until she could receive orthopedic intervention back in her home community. Bilateral ankle surgery was completed 10/6 without complications.  She was admitted postop for pain control and therapy.  Short leg splints were placed. Creatinine was 0.82 and GFR greater than 60 indicating stage II renal disease.  There was minimal drop in H/H with preop values of 11.7/37 and postop 11.3/35.2. She was discharged to the SNF for rehab.  Past medical and surgical history: Includes history of nephrolithiasis. Surgeries and procedures include abdominal hysterectomy, cystoscopy with retrogrades and ureteral stent placement.  She has also had a laparoscopic bilateral salpingo-oophorectomy.  Robot assisted pyeloplasty was also previously performed.  Family history: Her father had pancreatic and liver cancer;  mother had atrial fibrillation; and brother had a plasmacytoma of the vertebrae.  Social history: She is a non-smoker.  She states that she did drink some alcohol while on the cruise but not to any significant degree.   Review of systems: She does describe recent itchy,watery eyes.  She has chronic nasal congestion for which she has taken decongestants.  Constipation was related to her pain medications.  Constitutional: She denies any fever, weight loss, or fatigue. Eyes: No redness, discharge, pain, vision change ENT/mouth: No nasal congestion, purulent discharge, earache, change in hearing, sore throat  Cardiovascular: No chest pain, palpitations, paroxysmal nocturnal dyspnea, claudication, edema  Respiratory: No cough, sputum production, hemoptysis, DOE, significant snoring, apnea Gastrointestinal: No heartburn, dysphagia, abdominal pain, nausea /vomiting, rectal bleeding, melena Genitourinary: No dysuria, hematuria, pyuria, incontinence, nocturia Musculoskeletal: No joint stiffness, joint swelling, weakness Dermatologic: No rash, pruritus, change in appearance of skin Neurologic: No dizziness, headache, syncope, seizures, numbness, tingling Psychiatric: No significant anxiety, depression, insomnia, anorexia Endocrine: No change in hair/skin/nails, excessive thirst, excessive hunger, excessive urination  Hematologic/lymphatic: No significant bruising, lymphadenopathy, abnormal bleeding Allergy/immunology: No  significant sneezing, urticaria, angioedema  Physical exam:  Pertinent or positive findings: She is alert and oriented and very interactive.  Lower extremities are in cast.  General appearance: Adequately nourished; no acute distress, increased work of breathing is present.   Lymphatic: No lymphadenopathy about the head, neck, axilla. Eyes: No conjunctival inflammation or lid edema is present. There is no scleral icterus. Ears:  External ear exam shows no significant lesions or  deformities.   Nose:  External nasal examination shows no deformity or inflammation. Nasal mucosa are pink and moist without lesions, exudates Oral exam: Lips and gums are  healthy appearing.There is no oropharyngeal erythema or exudate. Neck:  No thyromegaly, masses, tenderness noted.    Heart:  Normal rate and regular rhythm. S1 and S2 normal without gallop, murmur, click, rub.  Lungs: Chest clear to auscultation without wheezes, rhonchi, rales, rubs. Abdomen: Bowel sounds are normal.  Abdomen is soft and nontender with no organomegaly, hernias, masses. GU: Deferred  Extremities:  No cyanosis, clubbing, edema. Neurologic exam: Balance, Rhomberg, finger to nose testing could not be completed due to clinical state Skin: Warm & dry w/o tenting. No significant lesions or rash.  See clinical summary under each active problem in the Problem List with associated updated therapeutic plan

## 2023-04-14 NOTE — Patient Instructions (Signed)
See assessment and plan under each diagnosis in the problem list and acutely for this visit 

## 2023-04-14 NOTE — Assessment & Plan Note (Addendum)
Rule out postural hypotension as a prodrome to her fall and ankle fractures. PT/OT at SNF as tolerated.

## 2023-04-15 DIAGNOSIS — D649 Anemia, unspecified: Secondary | ICD-10-CM | POA: Insufficient documentation

## 2023-04-15 NOTE — Assessment & Plan Note (Signed)
Mild pre-existing anemia with minimal drop in H/H post bilateral ORIF of ankles: 11.7/37 pre op & 11.3/35.2 post op. No bleeding dyscrasias reported @ SNF; continue monitor.

## 2023-04-16 DIAGNOSIS — R0981 Nasal congestion: Secondary | ICD-10-CM | POA: Insufficient documentation

## 2023-04-17 ENCOUNTER — Other Ambulatory Visit: Payer: Self-pay | Admitting: Adult Health

## 2023-04-17 ENCOUNTER — Non-Acute Institutional Stay (SKILLED_NURSING_FACILITY): Payer: Self-pay | Admitting: Adult Health

## 2023-04-17 ENCOUNTER — Encounter: Payer: Self-pay | Admitting: Adult Health

## 2023-04-17 DIAGNOSIS — S82844D Nondisplaced bimalleolar fracture of right lower leg, subsequent encounter for closed fracture with routine healing: Secondary | ICD-10-CM | POA: Diagnosis not present

## 2023-04-17 DIAGNOSIS — S82845D Nondisplaced bimalleolar fracture of left lower leg, subsequent encounter for closed fracture with routine healing: Secondary | ICD-10-CM | POA: Diagnosis not present

## 2023-04-17 DIAGNOSIS — D649 Anemia, unspecified: Secondary | ICD-10-CM

## 2023-04-17 DIAGNOSIS — S82843S Displaced bimalleolar fracture of unspecified lower leg, sequela: Secondary | ICD-10-CM

## 2023-04-17 DIAGNOSIS — R0981 Nasal congestion: Secondary | ICD-10-CM

## 2023-04-17 NOTE — Progress Notes (Signed)
Location:  [facility] Room Number: 133 Place of Service:  SNF (31)   CODE STATUS: full   No Known Allergies  Chief Complaint  Patient presents with   Discharge Note    HPI:  She is being discharged to home with home health for pt/ot.  Her family already has her required dme. She will need her prescriptions written and will need to follow up with her medical provider. She had been hospitalized for bilateral bimalleolar fractures. She was admitted to this facility for short term rehab. Therapy: she is non-weight bearing.  She is using a slide board for her transfers with contact guard assist. She is ready for discharge.   Past Medical History:  Diagnosis Date   History of kidney stones     Past Surgical History:  Procedure Laterality Date   ABDOMINAL HYSTERECTOMY     CESAREAN SECTION     CYSTOSCOPY W/ RETROGRADES Right 06/17/2016   Procedure: CYSTOSCOPY WITH RETROGRADE PYELOGRAM;  Surgeon: Marcine Matar, MD;  Location: [facility];  Service: Urology;  Laterality: Right;   CYSTOSCOPY W/ URETERAL STENT PLACEMENT Right 09/08/2016   Procedure: CYSTOSCOPY WITH RETROGRADE PYELOGRAM/URETERAL STENT PLACEMENT;  Surgeon: Heloise Purpura, MD;  Location: [facility];  Service: Urology;  Laterality: Right;   CYSTOSCOPY WITH STENT PLACEMENT Right 06/17/2016   Procedure: CYSTOSCOPY WITH STENT PLACEMENT;  Surgeon: Marcine Matar, MD;  Location: [facility];  Service: Urology;  Laterality: Right;   LAPAROSCOPIC BILATERAL SALPINGO OOPHERECTOMY Bilateral    ORIF ANKLE FRACTURE Bilateral 04/05/2023   Procedure: OPEN REDUCTION INTERNAL FIXATION (ORIF) BILATERAL BIMALLEOLAR ANKLE FRACTURE;  Surgeon: Netta Cedars, MD;  Location: [facility];  Service: Orthopedics;  Laterality: Bilateral;  GENERAL AND REGIONAL   ROBOT ASSISTED PYELOPLASTY Right 09/08/2016   Procedure: ROBOTIC ASSISTED LAPAROSCOPIC PYELOPLASTY;  Surgeon: Heloise Purpura, MD;  Location: [facility];  Service: Urology;  Laterality:  Right;   SYNDESMOSIS REPAIR Bilateral 04/05/2023   Procedure: SYNDESMOSIS REPAIR;  Surgeon: Netta Cedars, MD;  [facility];  Service: Orthopedics;  Laterality: Bilateral;    Social History   Socioeconomic History   Marital status: Married    Spouse name: Not on file   Number of children: Not on file   Years of education: Not on file   Highest education level: Not on file  Occupational History   Not on file  Tobacco Use   Smoking status: Never   Smokeless tobacco: Never  Substance and Sexual Activity   Alcohol use: No   Drug use: No   Sexual activity: Yes    Birth control/protection: Surgical  Other Topics Concern   Not on file  Social History Narrative   Not on file   Social Determinants of Health   Financial Resource Strain: Not on file  Food Insecurity: No Food Insecurity (04/05/2023)   Hunger Vital Sign    Worried About Running Out of Food in the Last Year: Never true    Ran Out of Food in the Last Year: Never true  Transportation Needs: No Transportation Needs (04/05/2023)   PRAPARE - Administrator, Civil Service (Medical): No    Lack of Transportation (Non-Medical): No  Physical Activity: Not on file  Stress: Not on file  Social Connections: Unknown (01/30/2023)   Received from [facility]   Social Network    Social Network: Not on file  Intimate Partner Violence: Not At Risk (04/05/2023)   Humiliation, Afraid, Rape, and Kick questionnaire    Fear of Current  or Ex-Partner: No    Emotionally Abused: No    Physically Abused: No    Sexually Abused: No   No family history on file.    VITAL SIGNS BP 105/64   Pulse 82   Temp (!) 97.5 F (36.4 C)   Resp 16   Ht 5\' 2"  (1.575 m)   Wt 155 lb (70.3 kg)   SpO2 98%   BMI 28.35 kg/m   Outpatient Encounter Medications as of 04/17/2023  Medication Sig   acetaminophen (TYLENOL) 650 MG CR tablet Take 650 mg by mouth every 6 (six) hours.   aspirin EC 81 MG tablet Take 81 mg by mouth in the  morning and at bedtime. Swallow whole.   Cholecalciferol (VITAMIN D3 PO) Take 5,000 Units by mouth daily.   pseudoephedrine (SUDAFED) 30 MG tablet Take 30 mg by mouth every 8 (eight) hours as needed for congestion.   sennosides-docusate sodium (SENOKOT-S) 8.6-50 MG tablet Take 1 tablet by mouth daily.   No facility-administered encounter medications on file as of 04/17/2023.    Review of Systems  Constitutional:  Negative for malaise/fatigue.  Respiratory:  Negative for cough and shortness of breath.   Cardiovascular:  Negative for chest pain, palpitations and leg swelling.  Gastrointestinal:  Negative for abdominal pain, constipation and heartburn.  Musculoskeletal:  Negative for back pain, joint pain and myalgias.  Skin: Negative.   Neurological:  Negative for dizziness.  Psychiatric/Behavioral:  The patient is not nervous/anxious.    Physical Exam Constitutional:      General: She is not in acute distress.    Appearance: She is well-developed. She is not diaphoretic.  Neck:     Thyroid: No thyromegaly.  Cardiovascular:     Rate and Rhythm: Normal rate and regular rhythm.     Heart sounds: Normal heart sounds.  Pulmonary:     Effort: Pulmonary effort is normal. No respiratory distress.     Breath sounds: Normal breath sounds.  Abdominal:     General: Bowel sounds are normal. There is no distension.     Palpations: Abdomen is soft.     Tenderness: There is no abdominal tenderness.  Musculoskeletal:     Cervical back: Neck supple.     Right lower leg: No edema.     Left lower leg: No edema.     Comments: Bilateral lower extremity casts  Lymphadenopathy:     Cervical: No cervical adenopathy.  Skin:    General: Skin is warm and dry.  Neurological:     Mental Status: She is alert and oriented to person, place, and time.  Psychiatric:        Mood and Affect: Mood normal.      ASSESSMENT/ PLAN:   Patient is being discharged with the following home health services:   pt/ot to evaluate and treat as indicated for gait balance strength adl training.   Patient is being discharged with the following durable medical equipment:  family has purchased   Patient has been advised to f/u with their PCP in 1-2 weeks to for a transitions of care visit.  Social services at their facility was responsible for arranging this appointment.  Pt was provided with adequate prescriptions of noncontrolled medications to reach the scheduled appointment .  For controlled substances, a limited supply was provided as appropriate for the individual patient.  If the pt normally receives these medications from a pain clinic or has a contract with another physician, these medications should be received from that  clinic or physician only).    Her medication are all OTC   Synthia Innocent NP [facility]  call [phone]

## 2023-04-20 DIAGNOSIS — D649 Anemia, unspecified: Secondary | ICD-10-CM | POA: Diagnosis not present

## 2023-04-20 DIAGNOSIS — Z9181 History of falling: Secondary | ICD-10-CM | POA: Diagnosis not present

## 2023-04-20 DIAGNOSIS — Z960 Presence of urogenital implants: Secondary | ICD-10-CM | POA: Diagnosis not present

## 2023-04-20 DIAGNOSIS — S82842D Displaced bimalleolar fracture of left lower leg, subsequent encounter for closed fracture with routine healing: Secondary | ICD-10-CM | POA: Diagnosis not present

## 2023-04-20 DIAGNOSIS — S82841D Displaced bimalleolar fracture of right lower leg, subsequent encounter for closed fracture with routine healing: Secondary | ICD-10-CM | POA: Diagnosis not present

## 2023-04-20 DIAGNOSIS — Z87442 Personal history of urinary calculi: Secondary | ICD-10-CM | POA: Diagnosis not present

## 2023-04-20 DIAGNOSIS — Z7982 Long term (current) use of aspirin: Secondary | ICD-10-CM | POA: Diagnosis not present

## 2023-04-22 DIAGNOSIS — Z23 Encounter for immunization: Secondary | ICD-10-CM | POA: Diagnosis not present

## 2023-04-22 DIAGNOSIS — R55 Syncope and collapse: Secondary | ICD-10-CM | POA: Diagnosis not present

## 2023-04-24 DIAGNOSIS — S82842D Displaced bimalleolar fracture of left lower leg, subsequent encounter for closed fracture with routine healing: Secondary | ICD-10-CM | POA: Diagnosis not present

## 2023-04-24 DIAGNOSIS — Z9181 History of falling: Secondary | ICD-10-CM | POA: Diagnosis not present

## 2023-04-24 DIAGNOSIS — Z960 Presence of urogenital implants: Secondary | ICD-10-CM | POA: Diagnosis not present

## 2023-04-24 DIAGNOSIS — D649 Anemia, unspecified: Secondary | ICD-10-CM | POA: Diagnosis not present

## 2023-04-24 DIAGNOSIS — Z87442 Personal history of urinary calculi: Secondary | ICD-10-CM | POA: Diagnosis not present

## 2023-04-24 DIAGNOSIS — Z7982 Long term (current) use of aspirin: Secondary | ICD-10-CM | POA: Diagnosis not present

## 2023-04-24 DIAGNOSIS — S82841D Displaced bimalleolar fracture of right lower leg, subsequent encounter for closed fracture with routine healing: Secondary | ICD-10-CM | POA: Diagnosis not present

## 2023-04-27 DIAGNOSIS — Z4889 Encounter for other specified surgical aftercare: Secondary | ICD-10-CM | POA: Diagnosis not present

## 2023-04-29 DIAGNOSIS — S82842D Displaced bimalleolar fracture of left lower leg, subsequent encounter for closed fracture with routine healing: Secondary | ICD-10-CM | POA: Diagnosis not present

## 2023-04-29 DIAGNOSIS — S82841D Displaced bimalleolar fracture of right lower leg, subsequent encounter for closed fracture with routine healing: Secondary | ICD-10-CM | POA: Diagnosis not present

## 2023-04-29 DIAGNOSIS — Z9181 History of falling: Secondary | ICD-10-CM | POA: Diagnosis not present

## 2023-04-29 DIAGNOSIS — Z960 Presence of urogenital implants: Secondary | ICD-10-CM | POA: Diagnosis not present

## 2023-04-29 DIAGNOSIS — Z87442 Personal history of urinary calculi: Secondary | ICD-10-CM | POA: Diagnosis not present

## 2023-04-29 DIAGNOSIS — D649 Anemia, unspecified: Secondary | ICD-10-CM | POA: Diagnosis not present

## 2023-04-29 DIAGNOSIS — Z7982 Long term (current) use of aspirin: Secondary | ICD-10-CM | POA: Diagnosis not present

## 2023-05-05 DIAGNOSIS — Z9181 History of falling: Secondary | ICD-10-CM | POA: Diagnosis not present

## 2023-05-05 DIAGNOSIS — S82841D Displaced bimalleolar fracture of right lower leg, subsequent encounter for closed fracture with routine healing: Secondary | ICD-10-CM | POA: Diagnosis not present

## 2023-05-05 DIAGNOSIS — Z87442 Personal history of urinary calculi: Secondary | ICD-10-CM | POA: Diagnosis not present

## 2023-05-05 DIAGNOSIS — D649 Anemia, unspecified: Secondary | ICD-10-CM | POA: Diagnosis not present

## 2023-05-05 DIAGNOSIS — Z960 Presence of urogenital implants: Secondary | ICD-10-CM | POA: Diagnosis not present

## 2023-05-05 DIAGNOSIS — S82842D Displaced bimalleolar fracture of left lower leg, subsequent encounter for closed fracture with routine healing: Secondary | ICD-10-CM | POA: Diagnosis not present

## 2023-05-05 DIAGNOSIS — Z7982 Long term (current) use of aspirin: Secondary | ICD-10-CM | POA: Diagnosis not present

## 2023-05-12 DIAGNOSIS — S82842D Displaced bimalleolar fracture of left lower leg, subsequent encounter for closed fracture with routine healing: Secondary | ICD-10-CM | POA: Diagnosis not present

## 2023-05-12 DIAGNOSIS — Z7982 Long term (current) use of aspirin: Secondary | ICD-10-CM | POA: Diagnosis not present

## 2023-05-12 DIAGNOSIS — D649 Anemia, unspecified: Secondary | ICD-10-CM | POA: Diagnosis not present

## 2023-05-12 DIAGNOSIS — Z960 Presence of urogenital implants: Secondary | ICD-10-CM | POA: Diagnosis not present

## 2023-05-12 DIAGNOSIS — Z87442 Personal history of urinary calculi: Secondary | ICD-10-CM | POA: Diagnosis not present

## 2023-05-12 DIAGNOSIS — Z9181 History of falling: Secondary | ICD-10-CM | POA: Diagnosis not present

## 2023-05-12 DIAGNOSIS — S82841D Displaced bimalleolar fracture of right lower leg, subsequent encounter for closed fracture with routine healing: Secondary | ICD-10-CM | POA: Diagnosis not present

## 2023-05-18 DIAGNOSIS — S82842D Displaced bimalleolar fracture of left lower leg, subsequent encounter for closed fracture with routine healing: Secondary | ICD-10-CM | POA: Diagnosis not present

## 2023-05-18 DIAGNOSIS — S82841D Displaced bimalleolar fracture of right lower leg, subsequent encounter for closed fracture with routine healing: Secondary | ICD-10-CM | POA: Diagnosis not present

## 2023-05-22 DIAGNOSIS — S82842D Displaced bimalleolar fracture of left lower leg, subsequent encounter for closed fracture with routine healing: Secondary | ICD-10-CM | POA: Diagnosis not present

## 2023-05-22 DIAGNOSIS — Z9181 History of falling: Secondary | ICD-10-CM | POA: Diagnosis not present

## 2023-05-22 DIAGNOSIS — D649 Anemia, unspecified: Secondary | ICD-10-CM | POA: Diagnosis not present

## 2023-05-22 DIAGNOSIS — S82841D Displaced bimalleolar fracture of right lower leg, subsequent encounter for closed fracture with routine healing: Secondary | ICD-10-CM | POA: Diagnosis not present

## 2023-05-22 DIAGNOSIS — Z960 Presence of urogenital implants: Secondary | ICD-10-CM | POA: Diagnosis not present

## 2023-05-22 DIAGNOSIS — Z87442 Personal history of urinary calculi: Secondary | ICD-10-CM | POA: Diagnosis not present

## 2023-05-22 DIAGNOSIS — Z7982 Long term (current) use of aspirin: Secondary | ICD-10-CM | POA: Diagnosis not present

## 2023-05-26 DIAGNOSIS — Z13220 Encounter for screening for lipoid disorders: Secondary | ICD-10-CM | POA: Diagnosis not present

## 2023-05-26 DIAGNOSIS — E038 Other specified hypothyroidism: Secondary | ICD-10-CM | POA: Diagnosis not present

## 2023-05-26 DIAGNOSIS — Z0000 Encounter for general adult medical examination without abnormal findings: Secondary | ICD-10-CM | POA: Diagnosis not present

## 2023-05-26 DIAGNOSIS — Z1329 Encounter for screening for other suspected endocrine disorder: Secondary | ICD-10-CM | POA: Diagnosis not present

## 2023-06-02 DIAGNOSIS — K080 Exfoliation of teeth due to systemic causes: Secondary | ICD-10-CM | POA: Diagnosis not present

## 2023-06-03 DIAGNOSIS — Z0000 Encounter for general adult medical examination without abnormal findings: Secondary | ICD-10-CM | POA: Diagnosis not present

## 2023-06-04 DIAGNOSIS — Z0001 Encounter for general adult medical examination with abnormal findings: Secondary | ICD-10-CM | POA: Diagnosis not present

## 2023-06-05 ENCOUNTER — Other Ambulatory Visit: Payer: Medicare Other

## 2023-06-05 ENCOUNTER — Encounter: Payer: Self-pay | Admitting: Internal Medicine

## 2023-06-05 ENCOUNTER — Other Ambulatory Visit: Payer: Self-pay | Admitting: Internal Medicine

## 2023-06-05 ENCOUNTER — Ambulatory Visit: Payer: Medicare Other | Attending: Internal Medicine | Admitting: Internal Medicine

## 2023-06-05 VITALS — BP 122/70 | HR 73 | Ht 62.0 in

## 2023-06-05 DIAGNOSIS — R55 Syncope and collapse: Secondary | ICD-10-CM

## 2023-06-05 DIAGNOSIS — T671XXA Heat syncope, initial encounter: Secondary | ICD-10-CM

## 2023-06-05 NOTE — Progress Notes (Signed)
Cardiology Office Note  Date: 06/05/2023   ID: Tracey Sparks, Tracey Sparks [birth year], MRN [id number]  PCP:  Estanislado Pandy, MD  Cardiologist:  None Electrophysiologist:  None   History of Present Illness: Tracey Sparks is a 67 y.o. female with no past medical history was referred to cardiology clinic for evaluation of syncope.  Patient was in a swimming pool area on a cruise in September 2024 when the [place] temperature was 5F.  She had been chatting with her friends in the pool and after she got outside the pool, was walking towards elevators, was when she had dizziness and passed out.  Her husband thinks she might have had heat exhaustion and passed out.  EKG was performed in the close that showed sinus bradycardia, HR 42 bpm.   Past Medical History:  Diagnosis Date   History of kidney stones     Past Surgical History:  Procedure Laterality Date   ABDOMINAL HYSTERECTOMY     CESAREAN SECTION     CYSTOSCOPY W/ RETROGRADES Right 06/17/2016   Procedure: CYSTOSCOPY WITH RETROGRADE PYELOGRAM;  Surgeon: Marcine Matar, MD;  [facility]: [facility];  Service: Urology;  Laterality: Right;   CYSTOSCOPY W/ URETERAL STENT PLACEMENT Right 09/08/2016   Procedure: CYSTOSCOPY WITH RETROGRADE PYELOGRAM/URETERAL STENT PLACEMENT;  Surgeon: Heloise Purpura, MD;  [facility]: [facility];  Service: Urology;  Laterality: Right;   CYSTOSCOPY WITH STENT PLACEMENT Right 06/17/2016   Procedure: CYSTOSCOPY WITH STENT PLACEMENT;  Surgeon: Marcine Matar, MD;  [facility]: [facility];  Service: Urology;  Laterality: Right;   LAPAROSCOPIC BILATERAL SALPINGO OOPHERECTOMY Bilateral    ORIF ANKLE FRACTURE Bilateral 04/05/2023   Procedure: OPEN REDUCTION INTERNAL FIXATION (ORIF) BILATERAL BIMALLEOLAR ANKLE FRACTURE;  Surgeon: Netta Cedars, MD;  [facility];  Service: Orthopedics;  Laterality: Bilateral;  GENERAL AND REGIONAL   ROBOT ASSISTED PYELOPLASTY Right 09/08/2016   Procedure: ROBOTIC ASSISTED LAPAROSCOPIC PYELOPLASTY;   Surgeon: Heloise Purpura, MD;  [facility]: [facility];  Service: Urology;  Laterality: Right;   SYNDESMOSIS REPAIR Bilateral 04/05/2023   Procedure: SYNDESMOSIS REPAIR;  Surgeon: Netta Cedars, MD;  [facility];  Service: Orthopedics;  Laterality: Bilateral;    Current Outpatient Medications  Medication Sig Dispense Refill   acetaminophen (TYLENOL) 650 MG CR tablet Take 650 mg by mouth every 6 (six) hours.     aspirin EC 81 MG tablet Take 81 mg by mouth in the morning and at bedtime. Swallow whole.     calcium carbonate (OSCAL) 1500 (600 Ca) MG TABS tablet Take by mouth 2 (two) times daily with a meal.     Cholecalciferol (VITAMIN D3 PO) Take 5,000 Units by mouth daily.     pseudoephedrine (SUDAFED) 30 MG tablet Take 30 mg by mouth every 8 (eight) hours as needed for congestion.     sennosides-docusate sodium (SENOKOT-S) 8.6-50 MG tablet Take 1 tablet by mouth daily.     No current facility-administered medications for this visit.   Allergies:  Patient has no known allergies.   Social History: The patient  reports that she has never smoked. She has never used smokeless tobacco. She reports that she does not drink alcohol and does not use drugs.   Family History: The patient's family history is not on file.   ROS:  Please see the history of present illness. Otherwise, complete review of systems is positive for none  All other systems are reviewed and negative.   Physical Exam: VS:  BP 122/70   Pulse 73   Ht  5\' 2"  (1.575 m)   BMI 28.35 kg/m , BMI Body mass index is 28.35 kg/m.  Wt Readings from Last 3 Encounters:  04/17/23 155 lb (70.3 kg)  04/14/23 160 lb 1.6 oz (72.6 kg)  04/13/23 160 lb 1.6 oz (72.6 kg)    General: Patient appears comfortable at rest. HEENT: Conjunctiva and lids normal, oropharynx clear with moist mucosa. Neck: Supple, no elevated JVP or carotid bruits, no thyromegaly. Lungs: Clear to auscultation, nonlabored breathing at rest. Cardiac: Regular rate and  rhythm, no S3 or significant systolic murmur, no pericardial rub. Abdomen: Soft, nontender, no hepatomegaly, bowel sounds present, no guarding or rebound. Extremities: No pitting edema, distal pulses 2+. Skin: Warm and dry. Musculoskeletal: No kyphosis. Neuropsychiatric: Alert and oriented x3, affect grossly appropriate.  Recent Labwork: 04/05/2023: Creatinine, Ser 0.82; Hemoglobin 11.3; Platelets 237  No results found for: "CHOL", "TRIG", "HDL", "CHOLHDL", "VLDL", "LDLCALC", "LDLDIRECT"    Assessment and Plan:  Syncope likely secondary to heat exhaustion vs vasovagal   -1 episode of syncope in 03/2023 on cruise likely secondary to heat exhaustion.  No recurrence of syncopal episodes since then.  Will obtain 2-week event monitor to rule out intermittent high-grade AV block and also intermittent arrhythmias (has family history of A-fib).       Medication Adjustments/Labs and Tests Ordered: Current medicines are reviewed at length with the patient today.  Concerns regarding medicines are outlined above.    Disposition:  Follow up  pending results  Signed Karlei Waldo Verne Spurr, MD, 06/05/2023 10:25 AM    [facility] at [facility] [address] [place], [place], [place] [address]

## 2023-06-05 NOTE — Patient Instructions (Addendum)
Medication Instructions:  Your physician recommends that you continue on your current medications as directed. Please refer to the Current Medication list given to you today.   Labwork: None  Testing/Procedures: .Your physician has recommended that you wear a Zio monitor.   This monitor is a medical device that records the heart's electrical activity. Doctors most often use these monitors to diagnose arrhythmias. Arrhythmias are problems with the speed or rhythm of the heartbeat. The monitor is a small device applied to your chest. You can wear one while you do your normal daily activities. While wearing this monitor if you have any symptoms to push the button and record what you felt. Once you have worn this monitor for the period of time provider prescribed (for 14 days), you will return the monitor device in the postage paid box. Once it is returned they will download the data collected and provide [place] with a report which the provider will then review and we will call you with those results. Important tips:  Avoid showering during the first 24 hours of wearing the monitor. Avoid excessive sweating to help maximize wear time. Do not submerge the device, no hot tubs, and no swimming pools. Keep any lotions or oils away from the patch. After 24 hours you may shower with the patch on. Take brief showers with your back facing the shower head.  Do not remove patch once it has been placed because that will interrupt data and decrease adhesive wear time. Push the button when you have any symptoms and write down what you were feeling. Once you have completed wearing your monitor, remove and place into box which has postage paid and place in your outgoing mailbox.  If for some reason you have misplaced your box then call our office and we can provide another box and/or mail it off for you.   Follow-Up: Your physician recommends that you schedule a follow-up appointment in: Pending Results  Any Other  Special Instructions Will Be Listed Below (If Applicable).  If you need a refill on your cardiac medications before your next appointment, please call your pharmacy.

## 2023-06-06 DIAGNOSIS — R55 Syncope and collapse: Secondary | ICD-10-CM | POA: Diagnosis not present

## 2023-06-08 DIAGNOSIS — S82841D Displaced bimalleolar fracture of right lower leg, subsequent encounter for closed fracture with routine healing: Secondary | ICD-10-CM | POA: Diagnosis not present

## 2023-06-08 DIAGNOSIS — S82842D Displaced bimalleolar fracture of left lower leg, subsequent encounter for closed fracture with routine healing: Secondary | ICD-10-CM | POA: Diagnosis not present

## 2023-06-09 DIAGNOSIS — L820 Inflamed seborrheic keratosis: Secondary | ICD-10-CM | POA: Diagnosis not present

## 2023-06-09 DIAGNOSIS — L570 Actinic keratosis: Secondary | ICD-10-CM | POA: Diagnosis not present

## 2023-06-09 DIAGNOSIS — Z9181 History of falling: Secondary | ICD-10-CM | POA: Diagnosis not present

## 2023-06-09 DIAGNOSIS — Z87442 Personal history of urinary calculi: Secondary | ICD-10-CM | POA: Diagnosis not present

## 2023-06-09 DIAGNOSIS — Z960 Presence of urogenital implants: Secondary | ICD-10-CM | POA: Diagnosis not present

## 2023-06-09 DIAGNOSIS — S82842D Displaced bimalleolar fracture of left lower leg, subsequent encounter for closed fracture with routine healing: Secondary | ICD-10-CM | POA: Diagnosis not present

## 2023-06-09 DIAGNOSIS — D649 Anemia, unspecified: Secondary | ICD-10-CM | POA: Diagnosis not present

## 2023-06-09 DIAGNOSIS — S82841D Displaced bimalleolar fracture of right lower leg, subsequent encounter for closed fracture with routine healing: Secondary | ICD-10-CM | POA: Diagnosis not present

## 2023-06-09 DIAGNOSIS — Z7982 Long term (current) use of aspirin: Secondary | ICD-10-CM | POA: Diagnosis not present

## 2023-06-09 DIAGNOSIS — X32XXXD Exposure to sunlight, subsequent encounter: Secondary | ICD-10-CM | POA: Diagnosis not present

## 2023-06-12 DIAGNOSIS — D649 Anemia, unspecified: Secondary | ICD-10-CM | POA: Diagnosis not present

## 2023-06-12 DIAGNOSIS — Z960 Presence of urogenital implants: Secondary | ICD-10-CM | POA: Diagnosis not present

## 2023-06-12 DIAGNOSIS — Z7982 Long term (current) use of aspirin: Secondary | ICD-10-CM | POA: Diagnosis not present

## 2023-06-12 DIAGNOSIS — Z9181 History of falling: Secondary | ICD-10-CM | POA: Diagnosis not present

## 2023-06-12 DIAGNOSIS — S82841D Displaced bimalleolar fracture of right lower leg, subsequent encounter for closed fracture with routine healing: Secondary | ICD-10-CM | POA: Diagnosis not present

## 2023-06-12 DIAGNOSIS — S82842D Displaced bimalleolar fracture of left lower leg, subsequent encounter for closed fracture with routine healing: Secondary | ICD-10-CM | POA: Diagnosis not present

## 2023-06-12 DIAGNOSIS — Z87442 Personal history of urinary calculi: Secondary | ICD-10-CM | POA: Diagnosis not present

## 2023-06-16 DIAGNOSIS — Z87442 Personal history of urinary calculi: Secondary | ICD-10-CM | POA: Diagnosis not present

## 2023-06-16 DIAGNOSIS — S82841D Displaced bimalleolar fracture of right lower leg, subsequent encounter for closed fracture with routine healing: Secondary | ICD-10-CM | POA: Diagnosis not present

## 2023-06-16 DIAGNOSIS — Z7982 Long term (current) use of aspirin: Secondary | ICD-10-CM | POA: Diagnosis not present

## 2023-06-16 DIAGNOSIS — S82842D Displaced bimalleolar fracture of left lower leg, subsequent encounter for closed fracture with routine healing: Secondary | ICD-10-CM | POA: Diagnosis not present

## 2023-06-16 DIAGNOSIS — Z9181 History of falling: Secondary | ICD-10-CM | POA: Diagnosis not present

## 2023-06-16 DIAGNOSIS — Z960 Presence of urogenital implants: Secondary | ICD-10-CM | POA: Diagnosis not present

## 2023-06-16 DIAGNOSIS — D649 Anemia, unspecified: Secondary | ICD-10-CM | POA: Diagnosis not present

## 2023-06-17 DIAGNOSIS — D649 Anemia, unspecified: Secondary | ICD-10-CM | POA: Diagnosis not present

## 2023-06-17 DIAGNOSIS — S82841D Displaced bimalleolar fracture of right lower leg, subsequent encounter for closed fracture with routine healing: Secondary | ICD-10-CM | POA: Diagnosis not present

## 2023-06-17 DIAGNOSIS — Z87442 Personal history of urinary calculi: Secondary | ICD-10-CM | POA: Diagnosis not present

## 2023-06-17 DIAGNOSIS — S82842D Displaced bimalleolar fracture of left lower leg, subsequent encounter for closed fracture with routine healing: Secondary | ICD-10-CM | POA: Diagnosis not present

## 2023-06-17 DIAGNOSIS — Z960 Presence of urogenital implants: Secondary | ICD-10-CM | POA: Diagnosis not present

## 2023-06-17 DIAGNOSIS — Z7982 Long term (current) use of aspirin: Secondary | ICD-10-CM | POA: Diagnosis not present

## 2023-06-17 DIAGNOSIS — Z9181 History of falling: Secondary | ICD-10-CM | POA: Diagnosis not present

## 2023-06-25 DIAGNOSIS — D649 Anemia, unspecified: Secondary | ICD-10-CM | POA: Diagnosis not present

## 2023-06-25 DIAGNOSIS — S82841D Displaced bimalleolar fracture of right lower leg, subsequent encounter for closed fracture with routine healing: Secondary | ICD-10-CM | POA: Diagnosis not present

## 2023-06-25 DIAGNOSIS — Z9181 History of falling: Secondary | ICD-10-CM | POA: Diagnosis not present

## 2023-06-25 DIAGNOSIS — Z960 Presence of urogenital implants: Secondary | ICD-10-CM | POA: Diagnosis not present

## 2023-06-25 DIAGNOSIS — S82842D Displaced bimalleolar fracture of left lower leg, subsequent encounter for closed fracture with routine healing: Secondary | ICD-10-CM | POA: Diagnosis not present

## 2023-06-25 DIAGNOSIS — Z7982 Long term (current) use of aspirin: Secondary | ICD-10-CM | POA: Diagnosis not present

## 2023-06-25 DIAGNOSIS — Z87442 Personal history of urinary calculi: Secondary | ICD-10-CM | POA: Diagnosis not present

## 2023-07-03 DIAGNOSIS — Z87442 Personal history of urinary calculi: Secondary | ICD-10-CM | POA: Diagnosis not present

## 2023-07-03 DIAGNOSIS — S82842D Displaced bimalleolar fracture of left lower leg, subsequent encounter for closed fracture with routine healing: Secondary | ICD-10-CM | POA: Diagnosis not present

## 2023-07-03 DIAGNOSIS — Z9181 History of falling: Secondary | ICD-10-CM | POA: Diagnosis not present

## 2023-07-03 DIAGNOSIS — Z960 Presence of urogenital implants: Secondary | ICD-10-CM | POA: Diagnosis not present

## 2023-07-03 DIAGNOSIS — D649 Anemia, unspecified: Secondary | ICD-10-CM | POA: Diagnosis not present

## 2023-07-03 DIAGNOSIS — Z7982 Long term (current) use of aspirin: Secondary | ICD-10-CM | POA: Diagnosis not present

## 2023-07-03 DIAGNOSIS — S82841D Displaced bimalleolar fracture of right lower leg, subsequent encounter for closed fracture with routine healing: Secondary | ICD-10-CM | POA: Diagnosis not present

## 2023-07-06 DIAGNOSIS — S82842D Displaced bimalleolar fracture of left lower leg, subsequent encounter for closed fracture with routine healing: Secondary | ICD-10-CM | POA: Diagnosis not present

## 2023-07-06 DIAGNOSIS — S82841D Displaced bimalleolar fracture of right lower leg, subsequent encounter for closed fracture with routine healing: Secondary | ICD-10-CM | POA: Diagnosis not present

## 2023-07-08 ENCOUNTER — Telehealth: Payer: Self-pay | Admitting: Internal Medicine

## 2023-07-08 NOTE — Telephone Encounter (Signed)
 Patient calling in about results from monitor. Please advise

## 2023-07-08 NOTE — Telephone Encounter (Signed)
 Advised that result available but not reviewed by provider. Advised that message would be sent to provider and if she didn't hear from [place] by late tomorrow, to contact our office again. Verbalized understanding.

## 2023-07-09 ENCOUNTER — Other Ambulatory Visit (HOSPITAL_COMMUNITY): Payer: Self-pay | Admitting: Family Medicine

## 2023-07-09 ENCOUNTER — Telehealth: Payer: Self-pay | Admitting: Internal Medicine

## 2023-07-09 DIAGNOSIS — S82841D Displaced bimalleolar fracture of right lower leg, subsequent encounter for closed fracture with routine healing: Secondary | ICD-10-CM | POA: Diagnosis not present

## 2023-07-09 DIAGNOSIS — S82842D Displaced bimalleolar fracture of left lower leg, subsequent encounter for closed fracture with routine healing: Secondary | ICD-10-CM | POA: Diagnosis not present

## 2023-07-09 DIAGNOSIS — Z960 Presence of urogenital implants: Secondary | ICD-10-CM | POA: Diagnosis not present

## 2023-07-09 DIAGNOSIS — D649 Anemia, unspecified: Secondary | ICD-10-CM | POA: Diagnosis not present

## 2023-07-09 DIAGNOSIS — Z1231 Encounter for screening mammogram for malignant neoplasm of breast: Secondary | ICD-10-CM

## 2023-07-09 NOTE — Telephone Encounter (Signed)
Patient is returning phone call in regards to heart monitor results. Please advise.

## 2023-07-10 DIAGNOSIS — Z7982 Long term (current) use of aspirin: Secondary | ICD-10-CM | POA: Diagnosis not present

## 2023-07-10 DIAGNOSIS — Z87442 Personal history of urinary calculi: Secondary | ICD-10-CM | POA: Diagnosis not present

## 2023-07-10 DIAGNOSIS — S82842D Displaced bimalleolar fracture of left lower leg, subsequent encounter for closed fracture with routine healing: Secondary | ICD-10-CM | POA: Diagnosis not present

## 2023-07-10 DIAGNOSIS — S82841D Displaced bimalleolar fracture of right lower leg, subsequent encounter for closed fracture with routine healing: Secondary | ICD-10-CM | POA: Diagnosis not present

## 2023-07-10 DIAGNOSIS — Z960 Presence of urogenital implants: Secondary | ICD-10-CM | POA: Diagnosis not present

## 2023-07-10 DIAGNOSIS — D649 Anemia, unspecified: Secondary | ICD-10-CM | POA: Diagnosis not present

## 2023-07-10 DIAGNOSIS — Z9181 History of falling: Secondary | ICD-10-CM | POA: Diagnosis not present

## 2023-07-10 NOTE — Telephone Encounter (Addendum)
 Patient informed and did not want phone visit. Copy sent to PCP

## 2023-07-10 NOTE — Telephone Encounter (Signed)
 See previous phone note for details

## 2023-07-14 DIAGNOSIS — Z87442 Personal history of urinary calculi: Secondary | ICD-10-CM | POA: Diagnosis not present

## 2023-07-14 DIAGNOSIS — Z960 Presence of urogenital implants: Secondary | ICD-10-CM | POA: Diagnosis not present

## 2023-07-14 DIAGNOSIS — S82842D Displaced bimalleolar fracture of left lower leg, subsequent encounter for closed fracture with routine healing: Secondary | ICD-10-CM | POA: Diagnosis not present

## 2023-07-14 DIAGNOSIS — Z7982 Long term (current) use of aspirin: Secondary | ICD-10-CM | POA: Diagnosis not present

## 2023-07-14 DIAGNOSIS — D649 Anemia, unspecified: Secondary | ICD-10-CM | POA: Diagnosis not present

## 2023-07-14 DIAGNOSIS — Z9181 History of falling: Secondary | ICD-10-CM | POA: Diagnosis not present

## 2023-07-14 DIAGNOSIS — S82841D Displaced bimalleolar fracture of right lower leg, subsequent encounter for closed fracture with routine healing: Secondary | ICD-10-CM | POA: Diagnosis not present

## 2023-07-16 DIAGNOSIS — M25562 Pain in left knee: Secondary | ICD-10-CM | POA: Diagnosis not present

## 2023-07-17 DIAGNOSIS — M25572 Pain in left ankle and joints of left foot: Secondary | ICD-10-CM | POA: Diagnosis not present

## 2023-07-17 DIAGNOSIS — M25672 Stiffness of left ankle, not elsewhere classified: Secondary | ICD-10-CM | POA: Diagnosis not present

## 2023-07-21 DIAGNOSIS — M25672 Stiffness of left ankle, not elsewhere classified: Secondary | ICD-10-CM | POA: Diagnosis not present

## 2023-07-21 DIAGNOSIS — M25571 Pain in right ankle and joints of right foot: Secondary | ICD-10-CM | POA: Diagnosis not present

## 2023-07-21 DIAGNOSIS — M25671 Stiffness of right ankle, not elsewhere classified: Secondary | ICD-10-CM | POA: Diagnosis not present

## 2023-07-21 DIAGNOSIS — M25572 Pain in left ankle and joints of left foot: Secondary | ICD-10-CM | POA: Diagnosis not present

## 2023-07-24 DIAGNOSIS — M25672 Stiffness of left ankle, not elsewhere classified: Secondary | ICD-10-CM | POA: Diagnosis not present

## 2023-07-24 DIAGNOSIS — M25571 Pain in right ankle and joints of right foot: Secondary | ICD-10-CM | POA: Diagnosis not present

## 2023-07-24 DIAGNOSIS — M25572 Pain in left ankle and joints of left foot: Secondary | ICD-10-CM | POA: Diagnosis not present

## 2023-07-24 DIAGNOSIS — M25671 Stiffness of right ankle, not elsewhere classified: Secondary | ICD-10-CM | POA: Diagnosis not present

## 2023-07-30 DIAGNOSIS — M25672 Stiffness of left ankle, not elsewhere classified: Secondary | ICD-10-CM | POA: Diagnosis not present

## 2023-07-30 DIAGNOSIS — M25571 Pain in right ankle and joints of right foot: Secondary | ICD-10-CM | POA: Diagnosis not present

## 2023-07-30 DIAGNOSIS — M25572 Pain in left ankle and joints of left foot: Secondary | ICD-10-CM | POA: Diagnosis not present

## 2023-07-30 DIAGNOSIS — M25671 Stiffness of right ankle, not elsewhere classified: Secondary | ICD-10-CM | POA: Diagnosis not present

## 2023-08-07 DIAGNOSIS — M25671 Stiffness of right ankle, not elsewhere classified: Secondary | ICD-10-CM | POA: Diagnosis not present

## 2023-08-07 DIAGNOSIS — M25572 Pain in left ankle and joints of left foot: Secondary | ICD-10-CM | POA: Diagnosis not present

## 2023-08-07 DIAGNOSIS — M25672 Stiffness of left ankle, not elsewhere classified: Secondary | ICD-10-CM | POA: Diagnosis not present

## 2023-08-07 DIAGNOSIS — M25571 Pain in right ankle and joints of right foot: Secondary | ICD-10-CM | POA: Diagnosis not present

## 2023-08-10 ENCOUNTER — Ambulatory Visit (HOSPITAL_COMMUNITY)
Admission: RE | Admit: 2023-08-10 | Discharge: 2023-08-10 | Disposition: A | Discharge status: Home / Self Care | Payer: Medicare Other | Source: Ambulatory Visit | Attending: Family Medicine | Admitting: Family Medicine

## 2023-08-10 DIAGNOSIS — Z1231 Encounter for screening mammogram for malignant neoplasm of breast: Secondary | ICD-10-CM | POA: Insufficient documentation

## 2023-08-11 DIAGNOSIS — M25671 Stiffness of right ankle, not elsewhere classified: Secondary | ICD-10-CM | POA: Diagnosis not present

## 2023-08-11 DIAGNOSIS — M25571 Pain in right ankle and joints of right foot: Secondary | ICD-10-CM | POA: Diagnosis not present

## 2023-08-11 DIAGNOSIS — M25672 Stiffness of left ankle, not elsewhere classified: Secondary | ICD-10-CM | POA: Diagnosis not present

## 2023-08-11 DIAGNOSIS — M25572 Pain in left ankle and joints of left foot: Secondary | ICD-10-CM | POA: Diagnosis not present

## 2023-08-13 DIAGNOSIS — M25671 Stiffness of right ankle, not elsewhere classified: Secondary | ICD-10-CM | POA: Diagnosis not present

## 2023-08-13 DIAGNOSIS — M25571 Pain in right ankle and joints of right foot: Secondary | ICD-10-CM | POA: Diagnosis not present

## 2023-08-13 DIAGNOSIS — M25572 Pain in left ankle and joints of left foot: Secondary | ICD-10-CM | POA: Diagnosis not present

## 2023-08-13 DIAGNOSIS — M25672 Stiffness of left ankle, not elsewhere classified: Secondary | ICD-10-CM | POA: Diagnosis not present

## 2023-08-17 DIAGNOSIS — M25671 Stiffness of right ankle, not elsewhere classified: Secondary | ICD-10-CM | POA: Diagnosis not present

## 2023-08-17 DIAGNOSIS — M25672 Stiffness of left ankle, not elsewhere classified: Secondary | ICD-10-CM | POA: Diagnosis not present

## 2023-08-17 DIAGNOSIS — M25572 Pain in left ankle and joints of left foot: Secondary | ICD-10-CM | POA: Diagnosis not present

## 2023-08-17 DIAGNOSIS — M25571 Pain in right ankle and joints of right foot: Secondary | ICD-10-CM | POA: Diagnosis not present

## 2023-08-20 DIAGNOSIS — H524 Presbyopia: Secondary | ICD-10-CM | POA: Diagnosis not present

## 2023-08-28 DIAGNOSIS — M25671 Stiffness of right ankle, not elsewhere classified: Secondary | ICD-10-CM | POA: Diagnosis not present

## 2023-08-28 DIAGNOSIS — M25672 Stiffness of left ankle, not elsewhere classified: Secondary | ICD-10-CM | POA: Diagnosis not present

## 2023-08-28 DIAGNOSIS — M25572 Pain in left ankle and joints of left foot: Secondary | ICD-10-CM | POA: Diagnosis not present

## 2023-08-28 DIAGNOSIS — M25571 Pain in right ankle and joints of right foot: Secondary | ICD-10-CM | POA: Diagnosis not present

## 2023-08-31 DIAGNOSIS — S82841A Displaced bimalleolar fracture of right lower leg, initial encounter for closed fracture: Secondary | ICD-10-CM | POA: Diagnosis not present

## 2023-08-31 DIAGNOSIS — S82842A Displaced bimalleolar fracture of left lower leg, initial encounter for closed fracture: Secondary | ICD-10-CM | POA: Diagnosis not present

## 2023-09-01 ENCOUNTER — Other Ambulatory Visit: Payer: Self-pay

## 2023-09-01 ENCOUNTER — Encounter (HOSPITAL_BASED_OUTPATIENT_CLINIC_OR_DEPARTMENT_OTHER): Payer: Self-pay | Admitting: Orthopaedic Surgery

## 2023-09-01 NOTE — Discharge Instructions (Signed)
 Tracey Cedars, MD [facility]  Please read the following information regarding your care after surgery.  Medications  You only need a prescription for the narcotic pain medicine (ex. oxycodone, Percocet, Norco).  All of the other medicines listed below are available over the counter. ? Aleve 2 pills twice a day for the first 3 days after surgery. ? acetominophen (Tylenol) 650 mg every 4-6 hours as you need for minor to moderate pain ? oxycodone as prescribed for severe pain  ? To help prevent blood clots, take aspirin (81 mg) twice daily for 28 days after surgery.  You should also get up every hour while you are awake to move around.  Weight Bearing ? OK to walk on the operative legs only AFTER the nerve block has completely worn off.  Cast / Splint / Dressing ? Keep your dressing clean and dry.  Don't put anything (coat hanger, pencil, etc) down inside of it.  If it gets wet, please notify the office immediately.  Swelling IMPORTANT: It is normal for you to have swelling where you had surgery. To reduce swelling and pain, keep at least 3 pillows under your leg so that your toes are above your nose and your heel is above the level of your hip.  It may be necessary to keep your foot or leg elevated for several weeks.  This is critical to helping your incisions heal and your pain to feel better.  Follow Up Call my office at [phone] when you are discharged from the hospital or surgery center to schedule an appointment to be seen within 7-10 days after surgery.  Call my office at [phone] if you develop a fever >101.5 F, nausea, vomiting, bleeding from the surgical site or severe pain.     Post Anesthesia Home Care Instructions  Activity: Get plenty of rest for the remainder of the day. A responsible individual must stay with you for 24 hours following the procedure.  For the next 24 hours, DO NOT: -Drive a car -Advertising copywriter -Drink alcoholic beverages -Take any  medication unless instructed by your physician -Make any legal decisions or sign important papers.  Meals: Start with liquid foods such as gelatin or soup. Progress to regular foods as tolerated. Avoid greasy, spicy, heavy foods. If nausea and/or vomiting occur, drink only clear liquids until the nausea and/or vomiting subsides. Call your physician if vomiting continues.  Special Instructions/Symptoms: Your throat may feel dry or sore from the anesthesia or the breathing tube placed in your throat during surgery. If this causes discomfort, gargle with warm salt water. The discomfort should disappear within 24 hours.  If you had a scopolamine patch placed behind your ear for the management of post- operative nausea and/or vomiting:  1. The medication in the patch is effective for 72 hours, after which it should be removed.  Wrap patch in a tissue and discard in the trash. Wash hands thoroughly with soap and water. 2. You may remove the patch earlier than 72 hours if you experience unpleasant side effects which may include dry mouth, dizziness or visual disturbances. 3. Avoid touching the patch. Wash your hands with soap and water after contact with the patch.

## 2023-09-01 NOTE — H&P (Signed)
 ORTHOPAEDIC SURGERY H&P  Subjective:  The patient presents with bilateral ankle hardware.   Past Medical History:  Diagnosis Date   History of kidney stones     Past Surgical History:  Procedure Laterality Date   ABDOMINAL HYSTERECTOMY     CESAREAN SECTION     CYSTOSCOPY W/ RETROGRADES Right 06/17/2016   Procedure: CYSTOSCOPY WITH RETROGRADE PYELOGRAM;  Surgeon: Marcine Matar, MD;  Location: [facility];  Service: Urology;  Laterality: Right;   CYSTOSCOPY W/ URETERAL STENT PLACEMENT Right 09/08/2016   Procedure: CYSTOSCOPY WITH RETROGRADE PYELOGRAM/URETERAL STENT PLACEMENT;  Surgeon: Heloise Purpura, MD;  Location: [facility];  Service: Urology;  Laterality: Right;   CYSTOSCOPY WITH STENT PLACEMENT Right 06/17/2016   Procedure: CYSTOSCOPY WITH STENT PLACEMENT;  Surgeon: Marcine Matar, MD;  Location: [facility];  Service: Urology;  Laterality: Right;   LAPAROSCOPIC BILATERAL SALPINGO OOPHERECTOMY Bilateral    ORIF ANKLE FRACTURE Bilateral 04/05/2023   Procedure: OPEN REDUCTION INTERNAL FIXATION (ORIF) BILATERAL BIMALLEOLAR ANKLE FRACTURE;  Surgeon: Netta Cedars, MD;  Location: [facility];  Service: Orthopedics;  Laterality: Bilateral;  GENERAL AND REGIONAL   ROBOT ASSISTED PYELOPLASTY Right 09/08/2016   Procedure: ROBOTIC ASSISTED LAPAROSCOPIC PYELOPLASTY;  Surgeon: Heloise Purpura, MD;  Location: [facility];  Service: Urology;  Laterality: Right;   SYNDESMOSIS REPAIR Bilateral 04/05/2023   Procedure: SYNDESMOSIS REPAIR;  Surgeon: Netta Cedars, MD;  [facility]: [facility];  Service: Orthopedics;  Laterality: Bilateral;     (Not in an outpatient encounter)    No Known Allergies  Social History   Socioeconomic History   Marital status: Married    Spouse name: Not on file   Number of children: Not on file   Years of education: Not on file   Highest education level: Not on file  Occupational History   Not on file  Tobacco Use   Smoking status: Never   Smokeless tobacco: Never  Vaping Use    Vaping status: Never Used  Substance and Sexual Activity   Alcohol use: Yes    Comment: seldom   Drug use: No   Sexual activity: Yes    Birth control/protection: Surgical  Other Topics Concern   Not on file  Social History Narrative   Not on file   Social Drivers of Health   Financial Resource Strain: Not on file  Food Insecurity: No Food Insecurity (04/05/2023)   Hunger Vital Sign    Worried About Running Out of Food in the Last Year: Never true    Ran Out of Food in the Last Year: Never true  Transportation Needs: No Transportation Needs (04/05/2023)   PRAPARE - Administrator, Civil Service (Medical): No    Lack of Transportation (Non-Medical): No  Physical Activity: Not on file  Stress: Not on file  Social Connections: Unknown (01/30/2023)   Received from [facility]   Social Network    Social Network: Not on file  Intimate Partner Violence: Not At Risk (04/05/2023)   Humiliation, Afraid, Rape, and Kick questionnaire    Fear of Current or Ex-Partner: No    Emotionally Abused: No    Physically Abused: No    Sexually Abused: No     History reviewed. No pertinent family history.   Review of Systems Pertinent items are noted in HPI.  Objective: Vital signs in last 24 hours:    09/01/2023   12:15 PM 06/05/2023   10:14 AM 04/17/2023    3:04 PM  Vitals with BMI  Height 5\' 2"  5\' 2"  5'  2"  Weight 160 lbs  155 lbs  BMI 29.26  28.34  Systolic  122 105  Diastolic  70 64  Pulse  73 82      EXAM: General: Well nourished, well developed. Awake, alert and oriented to time, place, person. Normal mood and affect. No apparent distress. Breathing room air.  Bilateral Lower Extremity: Alignment - Neutral Deformity - None Skin intact Tenderness to palpation - None 5/5 TA, PT, GS, Per, EHL, FHL Sensation intact to light touch throughout Palpable DP and PT pulses Special testing: None   Imaging Review All images taken were independently reviewed by  me.  Assessment/Plan: The clinical and radiographic findings were reviewed and discussed at length with the patient.  The patient has bilateral ankle hardware.  We spoke at length about the natural course of these findings. We discussed nonoperative and operative treatment options in detail.  The risks and benefits were presented and reviewed. The risks due to inability to remove part/all of hardware, recurrent instability, hardware failure/irritation, new/persistent/recurrent infection, stiffness, nerve/vessel/tendon injury, nonunion/malunion of any fracture, wound healing issues, allograft usage, development of arthritis, failure of this surgery, possibility of external fixation in certain situations, possibility of delayed definitive surgery, need for further surgery, prolonged wound care including further soft tissue coverage procedures, thromboembolic events, anesthesia/medical complications/events perioperatively and beyond, amputation, death among others were discussed. The patient acknowledged the explanation and agreed to proceed with the plan.  Netta Cedars  Orthopaedic Surgery [facility]

## 2023-09-02 ENCOUNTER — Encounter (HOSPITAL_BASED_OUTPATIENT_CLINIC_OR_DEPARTMENT_OTHER)
Admission: RE | Disposition: A | Discharge status: Home / Self Care | Payer: Self-pay | Source: Home / Self Care | Attending: Orthopaedic Surgery

## 2023-09-02 ENCOUNTER — Ambulatory Visit (HOSPITAL_BASED_OUTPATIENT_CLINIC_OR_DEPARTMENT_OTHER): Admitting: Anesthesiology

## 2023-09-02 ENCOUNTER — Encounter (HOSPITAL_BASED_OUTPATIENT_CLINIC_OR_DEPARTMENT_OTHER): Payer: Self-pay | Admitting: Orthopaedic Surgery

## 2023-09-02 ENCOUNTER — Other Ambulatory Visit: Payer: Self-pay

## 2023-09-02 ENCOUNTER — Ambulatory Visit (HOSPITAL_BASED_OUTPATIENT_CLINIC_OR_DEPARTMENT_OTHER)

## 2023-09-02 ENCOUNTER — Ambulatory Visit (HOSPITAL_BASED_OUTPATIENT_CLINIC_OR_DEPARTMENT_OTHER)
Admission: RE | Admit: 2023-09-02 | Discharge: 2023-09-02 | Disposition: A | Discharge status: Home / Self Care | Attending: Orthopaedic Surgery | Admitting: Orthopaedic Surgery

## 2023-09-02 DIAGNOSIS — S82841A Displaced bimalleolar fracture of right lower leg, initial encounter for closed fracture: Secondary | ICD-10-CM | POA: Diagnosis not present

## 2023-09-02 DIAGNOSIS — Z01818 Encounter for other preprocedural examination: Secondary | ICD-10-CM

## 2023-09-02 DIAGNOSIS — S82842A Displaced bimalleolar fracture of left lower leg, initial encounter for closed fracture: Secondary | ICD-10-CM

## 2023-09-02 DIAGNOSIS — Z472 Encounter for removal of internal fixation device: Secondary | ICD-10-CM | POA: Diagnosis not present

## 2023-09-02 DIAGNOSIS — S82841D Displaced bimalleolar fracture of right lower leg, subsequent encounter for closed fracture with routine healing: Secondary | ICD-10-CM | POA: Diagnosis not present

## 2023-09-02 DIAGNOSIS — S82842D Displaced bimalleolar fracture of left lower leg, subsequent encounter for closed fracture with routine healing: Secondary | ICD-10-CM | POA: Insufficient documentation

## 2023-09-02 DIAGNOSIS — X58XXXD Exposure to other specified factors, subsequent encounter: Secondary | ICD-10-CM | POA: Insufficient documentation

## 2023-09-02 HISTORY — PX: HARDWARE REMOVAL: SHX979

## 2023-09-02 SURGERY — REMOVAL, HARDWARE
Anesthesia: General | Site: Ankle | Laterality: Bilateral

## 2023-09-02 MED ORDER — LIDOCAINE 2% (20 MG/ML) 5 ML SYRINGE
INTRAMUSCULAR | Status: AC
  Filled 2023-09-02: qty 5

## 2023-09-02 MED ORDER — PHENYLEPHRINE 80 MCG/ML (10ML) SYRINGE FOR IV PUSH (FOR BLOOD PRESSURE SUPPORT)
PREFILLED_SYRINGE | INTRAVENOUS | Status: AC
  Filled 2023-09-02: qty 10

## 2023-09-02 MED ORDER — 0.9 % SODIUM CHLORIDE (POUR BTL) OPTIME
TOPICAL | Status: DC | PRN
  Administered 2023-09-02: 50 mL

## 2023-09-02 MED ORDER — PROPOFOL 10 MG/ML IV BOLUS
INTRAVENOUS | Status: AC
  Filled 2023-09-02: qty 20

## 2023-09-02 MED ORDER — ACETAMINOPHEN 500 MG PO TABS
ORAL_TABLET | ORAL | Status: AC
  Filled 2023-09-02: qty 2

## 2023-09-02 MED ORDER — AMISULPRIDE (ANTIEMETIC) 5 MG/2ML IV SOLN: 10.0000 mg | Freq: Once | INTRAVENOUS | Status: DC | PRN

## 2023-09-02 MED ORDER — MIDAZOLAM HCL 5 MG/5ML IJ SOLN
INTRAMUSCULAR | Status: DC | PRN
  Administered 2023-09-02: 1 mg via INTRAVENOUS

## 2023-09-02 MED ORDER — DEXAMETHASONE SODIUM PHOSPHATE 10 MG/ML IJ SOLN
INTRAMUSCULAR | Status: DC | PRN
  Administered 2023-09-02: 10 mg via INTRAVENOUS

## 2023-09-02 MED ORDER — BUPIVACAINE-EPINEPHRINE (PF) 0.5% -1:200000 IJ SOLN
INTRAMUSCULAR | Status: DC | PRN
  Administered 2023-09-02: 20 mL

## 2023-09-02 MED ORDER — FENTANYL CITRATE (PF) 100 MCG/2ML IJ SOLN: 25.0000 ug | INTRAMUSCULAR | Status: DC | PRN

## 2023-09-02 MED ORDER — PROPOFOL 10 MG/ML IV BOLUS
INTRAVENOUS | Status: DC | PRN
  Administered 2023-09-02: 150 mg via INTRAVENOUS

## 2023-09-02 MED ORDER — BUPIVACAINE-EPINEPHRINE (PF) 0.5% -1:200000 IJ SOLN
INTRAMUSCULAR | Status: AC
  Filled 2023-09-02: qty 30

## 2023-09-02 MED ORDER — FENTANYL CITRATE (PF) 100 MCG/2ML IJ SOLN
INTRAMUSCULAR | Status: DC | PRN
  Administered 2023-09-02 (×2): 50 ug via INTRAVENOUS

## 2023-09-02 MED ORDER — LIDOCAINE 2% (20 MG/ML) 5 ML SYRINGE
INTRAMUSCULAR | Status: DC | PRN
  Administered 2023-09-02: 60 mg via INTRAVENOUS

## 2023-09-02 MED ORDER — ONDANSETRON HCL 4 MG/2ML IJ SOLN
INTRAMUSCULAR | Status: DC | PRN
  Administered 2023-09-02: 4 mg via INTRAVENOUS

## 2023-09-02 MED ORDER — DEXAMETHASONE SODIUM PHOSPHATE 10 MG/ML IJ SOLN
INTRAMUSCULAR | Status: AC
  Filled 2023-09-02: qty 1

## 2023-09-02 MED ORDER — OXYCODONE HCL 5 MG/5ML PO SOLN: 5.0000 mg | Freq: Once | ORAL | Status: DC | PRN

## 2023-09-02 MED ORDER — MIDAZOLAM HCL 2 MG/2ML IJ SOLN
INTRAMUSCULAR | Status: AC
Start: 2023-09-02 — End: ?
  Filled 2023-09-02: qty 2

## 2023-09-02 MED ORDER — PHENYLEPHRINE 80 MCG/ML (10ML) SYRINGE FOR IV PUSH (FOR BLOOD PRESSURE SUPPORT)
PREFILLED_SYRINGE | INTRAVENOUS | Status: DC | PRN
  Administered 2023-09-02 (×3): 80 ug via INTRAVENOUS

## 2023-09-02 MED ORDER — SUCCINYLCHOLINE CHLORIDE 200 MG/10ML IV SOSY
PREFILLED_SYRINGE | INTRAVENOUS | Status: AC
  Filled 2023-09-02: qty 10

## 2023-09-02 MED ORDER — LACTATED RINGERS IV SOLN: INTRAVENOUS | Status: DC

## 2023-09-02 MED ORDER — ACETAMINOPHEN 500 MG PO TABS
1000.0000 mg | ORAL_TABLET | Freq: Once | ORAL | Status: AC
  Administered 2023-09-02: 1000 mg via ORAL

## 2023-09-02 MED ORDER — OXYCODONE HCL 5 MG PO TABS: 5.0000 mg | ORAL_TABLET | Freq: Once | ORAL | Status: DC | PRN

## 2023-09-02 MED ORDER — CEFAZOLIN SODIUM-DEXTROSE 2-4 GM/100ML-% IV SOLN
INTRAVENOUS | Status: AC
  Filled 2023-09-02: qty 100

## 2023-09-02 MED ORDER — CHLORHEXIDINE GLUCONATE 4 % EX SOLN: 60.0000 mL | Freq: Once | CUTANEOUS | Status: DC

## 2023-09-02 MED ORDER — ONDANSETRON HCL 4 MG/2ML IJ SOLN
INTRAMUSCULAR | Status: AC
  Filled 2023-09-02: qty 2

## 2023-09-02 MED ORDER — CEFAZOLIN SODIUM-DEXTROSE 2-4 GM/100ML-% IV SOLN
2.0000 g | INTRAVENOUS | Status: AC
  Administered 2023-09-02: 2 g via INTRAVENOUS

## 2023-09-02 MED ORDER — FENTANYL CITRATE (PF) 100 MCG/2ML IJ SOLN
INTRAMUSCULAR | Status: AC
  Filled 2023-09-02: qty 2

## 2023-09-02 SURGICAL SUPPLY — 56 items
BANDAGE ESMARK 6X9 LF (GAUZE/BANDAGES/DRESSINGS) ×1 IMPLANT
BLADE SURG 15 STRL LF DISP TIS (BLADE) ×4 IMPLANT
BNDG COHESIVE 4X5 TAN STRL LF (GAUZE/BANDAGES/DRESSINGS) ×1 IMPLANT
BNDG ELASTIC 4INX 5YD STR LF (GAUZE/BANDAGES/DRESSINGS) ×1 IMPLANT
BNDG ELASTIC 6INX 5YD STR LF (GAUZE/BANDAGES/DRESSINGS) IMPLANT
BNDG ESMARK 6X9 LF (GAUZE/BANDAGES/DRESSINGS) IMPLANT
BNDG GAUZE DERMACEA FLUFF 4 (GAUZE/BANDAGES/DRESSINGS) ×1 IMPLANT
BRUSH SCRUB EZ 4% CHG (MISCELLANEOUS) ×1 IMPLANT
CANISTER SUCT 1200ML W/VALVE (MISCELLANEOUS) ×1 IMPLANT
CHLORAPREP W/TINT 26 (MISCELLANEOUS) ×2 IMPLANT
CNTNR URN SCR LID CUP LEK RST (MISCELLANEOUS) IMPLANT
COVER BACK TABLE 60X90IN (DRAPES) ×1 IMPLANT
CUFF TRNQT CYL 34X4.125X (TOURNIQUET CUFF) IMPLANT
DRAPE BILATERAL LIMB T (DRAPES) IMPLANT
DRAPE C-ARM 42X72 X-RAY (DRAPES) IMPLANT
DRAPE C-ARMOR (DRAPES) IMPLANT
DRAPE EXTREMITY T 121X128X90 (DISPOSABLE) ×1 IMPLANT
DRAPE IMP U-DRAPE 54X76 (DRAPES) ×1 IMPLANT
DRAPE OEC MINIVIEW 54X84 (DRAPES) IMPLANT
DRAPE U-SHAPE 47X51 STRL (DRAPES) ×1 IMPLANT
DRSG MEPITEL 4X7.2 (GAUZE/BANDAGES/DRESSINGS) ×1 IMPLANT
ELECT REM PT RETURN 9FT ADLT (ELECTROSURGICAL) ×1 IMPLANT
ELECTRODE REM PT RTRN 9FT ADLT (ELECTROSURGICAL) ×1 IMPLANT
GAUZE PAD ABD 8X10 STRL (GAUZE/BANDAGES/DRESSINGS) IMPLANT
GAUZE SPONGE 4X4 12PLY STRL (GAUZE/BANDAGES/DRESSINGS) ×1 IMPLANT
GLOVE BIOGEL PI IND STRL 8 (GLOVE) ×1 IMPLANT
GLOVE SURG SS PI 7.5 STRL IVOR (GLOVE) ×2 IMPLANT
GOWN STRL REUS W/ TWL LRG LVL3 (GOWN DISPOSABLE) ×2 IMPLANT
MARKER SKIN DUAL TIP RULER LAB (MISCELLANEOUS) IMPLANT
NDL HYPO 25X1 1.5 SAFETY (NEEDLE) IMPLANT
NEEDLE HYPO 25X1 1.5 SAFETY (NEEDLE) ×1 IMPLANT
NS IRRIG 1000ML POUR BTL (IV SOLUTION) ×1 IMPLANT
PACK BASIN DAY SURGERY FS (CUSTOM PROCEDURE TRAY) ×1 IMPLANT
PAD CAST 4YDX4 CTTN HI CHSV (CAST SUPPLIES) ×1 IMPLANT
PADDING CAST ABS COTTON 4X4 ST (CAST SUPPLIES) IMPLANT
PADDING CAST COTTON 6X4 STRL (CAST SUPPLIES) ×1 IMPLANT
PADDING CAST SYNTHETIC 4X4 STR (CAST SUPPLIES) IMPLANT
PENCIL SMOKE EVACUATOR (MISCELLANEOUS) ×1 IMPLANT
SHEET MEDIUM DRAPE 40X70 STRL (DRAPES) ×1 IMPLANT
SLEEVE SCD COMPRESS KNEE MED (STOCKING) ×1 IMPLANT
SPIKE FLUID TRANSFER (MISCELLANEOUS) IMPLANT
SPLINT PLASTER CAST FAST 5X30 (CAST SUPPLIES) IMPLANT
SPONGE T-LAP 18X18 ~~LOC~~+RFID (SPONGE) ×1 IMPLANT
STOCKINETTE 6 STRL (DRAPES) ×1 IMPLANT
STOCKINETTE ORTHO 6X25 (MISCELLANEOUS) ×1 IMPLANT
SUCTION TUBE FRAZIER 10FR DISP (SUCTIONS) IMPLANT
SUT ETHILON 2 0 FS 18 (SUTURE) ×2 IMPLANT
SUT MNCRL AB 3-0 PS2 18 (SUTURE) ×1 IMPLANT
SUT VIC AB 2-0 SH 27XBRD (SUTURE) ×1 IMPLANT
SUT VIC AB 3-0 SH 27X BRD (SUTURE) IMPLANT
SUT VICRYL 0 SH 27 (SUTURE) IMPLANT
SYR BULB IRRIG 60ML STRL (SYRINGE) ×1 IMPLANT
SYR CONTROL 10ML LL (SYRINGE) IMPLANT
TOWEL GREEN STERILE FF (TOWEL DISPOSABLE) ×2 IMPLANT
TUBE CONNECTING 20X1/4 (TUBING) IMPLANT
UNDERPAD 30X36 HEAVY ABSORB (UNDERPADS AND DIAPERS) ×1 IMPLANT

## 2023-09-02 NOTE — Op Note (Signed)
 09/02/2023  4:17 PM   PATIENT: Tracey Sparks  68 y.o. female  MRN: [id number]   PRE-OPERATIVE DIAGNOSIS:   Closed bimalleolar fracture of bilateral ankles s/p ORIF   POST-OPERATIVE DIAGNOSIS:   Same   PROCEDURE: 1] Removal of deep hardware left ankle (syndesmosis fixation) 2] Removal of deep hardware right ankle (syndesmosis fixation)  SURGEON:  Netta Cedars, MD   ASSISTANT: None   ANESTHESIA: General, regional   EBL: Minimal   TOURNIQUET:   None used   COMPLICATIONS: None apparent   DISPOSITION: Extubated, awake and stable to recovery.   INDICATION FOR PROCEDURE: The patient presented with above diagnosis.  We discussed the diagnosis, alternative treatment options, risks and benefits of the above surgical intervention, as well as alternative non-operative treatments. All questions/concerns were addressed and the patient/family demonstrated appropriate understanding of the diagnosis, the procedure, the postoperative course, and overall prognosis. The patient wished to proceed with surgical intervention and signed an informed surgical consent as such, in each others presence prior to surgery.   PROCEDURE IN DETAIL: After preoperative consent was obtained and the correct operative site was identified, the patient was brought to the operating room supine on stretcher and transferred onto operating table. General anesthesia was induced. Preoperative antibiotics were administered. Surgical timeout was taken. The patient was then positioned supine. Both lower extremities were simultaneously prepped and draped in standard sterile fashion.  We began with the left ankle. Prior lateral approach was utilized over the distal fibula and dissection carried down to the level of plate. The syndesmosis screw was removed completely and stability of the anklewas noted to clinical and fluoroscopic testing.    We then proceeded to the right ankle. Prior lateral approach was  utilized over the distal fibula and dissection carried down to the level of plate. The syndesmosis screw was removed completely and stability of the anklewas noted to clinical and fluoroscopic testing.   The surgical sites were thoroughly irrigated. The tourniquet was deflated and hemostasis achieved. Betadine solution was used to irrigate and vancomycin powder applied. The skin was closed without tension.    Each leg was cleaned with saline and sterile mepitel dressings with gauze were applied. A well padded sterile wrap was applied on each side. The patient was awakened from anesthesia and transported to the recovery room in stable condition.    FOLLOW UP PLAN: -transfer to PACU, then home -progress to WBAT, maximum elevation -maintain dressings until follow up -DVT ppx: Aspirin 81 mg twice daily x 28 days -follow up as outpatient within 7-10 days for wound check -sutures out in 2-3 weeks in outpatient office   RADIOGRAPHS: 1] Left Ankle Series AP, lateral, oblique and stress radiographs of the left ankle were obtained intraoperatively. These showed interval removal of the syndesmosis screw. Manual stress radiographs were taken and the joints were noted to be stable following hardware removal. No other acute injuries are noted.  2] Right Ankle Series AP, lateral, oblique and stress radiographs of the right ankle were obtained intraoperatively. These showed interval removal of the syndesmosis screw. Manual stress radiographs were taken and the joints were noted to be stable following hardware removal. No other acute injuries are noted.  Netta Cedars [facility]

## 2023-09-02 NOTE — Transfer of Care (Signed)
 Immediate Anesthesia Transfer of Care Note  Patient: [name]  Procedure(s) Performed: REMOVAL, HARDWARE (Bilateral: Ankle)  Patient Location: PACU  Anesthesia Type:General  Level of Consciousness: awake  Airway & Oxygen Therapy: Patient Spontanous Breathing and Patient connected to face mask oxygen  Post-op Assessment: Report given to RN and Post -op Vital signs reviewed and stable  Post vital signs: Reviewed and stable  Last Vitals:  Vitals Value Taken Time  BP    Temp    Pulse 63 09/02/23 1455  Resp 15 09/02/23 1455  SpO2 100 % 09/02/23 1455  Vitals shown include unfiled device data.  Last Pain:  Vitals:   09/02/23 1037  TempSrc: Temporal  PainSc: 0-No pain         Complications: No notable events documented.

## 2023-09-02 NOTE — H&P (Signed)
 H&P Update:  -History and Physical Reviewed  -Patient has been re-examined  -No change in the plan of care  -The risks and benefits were presented and reviewed. The risks due to inability to remove part/all of hardware, recurrent instability, hardware/suture failure and/or irritation, new/persistent infection, stiffness, nerve/vessel/tendon injury or rerupture of repaired tendon, nonunion/malunion, allograft usage, wound healing issues, development of arthritis, failure of this surgery, possibility of external fixation with delayed definitive surgery, need for further surgery, thromboembolic events, anesthesia/medical complications, amputation, death among others were discussed. The patient acknowledged the explanation, agreed to proceed with the plan and a consent was signed.  Tracey Sparks

## 2023-09-02 NOTE — Anesthesia Procedure Notes (Signed)
 Procedure Name: LMA Insertion Date/Time: 09/02/2023 2:20 PM  Performed by: Roosvelt Harps, CRNAPre-anesthesia Checklist: Patient identified, Emergency Drugs available, Suction available and Patient being monitored Patient Re-evaluated:Patient Re-evaluated prior to induction Oxygen Delivery Method: Circle System Utilized Preoxygenation: Pre-oxygenation with 100% oxygen Induction Type: IV induction Ventilation: Mask ventilation without difficulty LMA: LMA inserted LMA Size: 4.0 Number of attempts: 1 Airway Equipment and Method: Bite block Placement Confirmation: positive ETCO2 Tube secured with: Tape Dental Injury: Teeth and Oropharynx as per pre-operative assessment

## 2023-09-02 NOTE — Anesthesia Preprocedure Evaluation (Addendum)
Anesthesia Evaluation  Patient identified by MRN, date of birth, ID band Patient awake    Reviewed: Allergy & Precautions, NPO status , Patient's Chart, lab work & pertinent test results  Airway Mallampati: II  TM Distance: >3 FB Neck ROM: Full    Dental no notable dental hx. (+) Teeth Intact, Dental Advisory Given   Pulmonary neg pulmonary ROS   Pulmonary exam normal breath sounds clear to auscultation       Cardiovascular negative cardio ROS Normal cardiovascular exam Rhythm:Regular Rate:Normal     Neuro/Psych negative neurological ROS  negative psych ROS   GI/Hepatic negative GI ROS, Neg liver ROS,,,  Endo/Other  negative endocrine ROS    Renal/GU negative Renal ROS  negative genitourinary   Musculoskeletal negative musculoskeletal ROS (+)    Abdominal   Peds  Hematology  (+) Blood dyscrasia, anemia   Anesthesia Other Findings   Reproductive/Obstetrics                             Anesthesia Physical Anesthesia Plan  ASA: 2  Anesthesia Plan: General   Post-op Pain Management: Tylenol PO (pre-op)*   Induction: Intravenous  PONV Risk Score and Plan: 3 and Ondansetron, Dexamethasone and Midazolam  Airway Management Planned: LMA  Additional Equipment:   Intra-op Plan:   Post-operative Plan: Extubation in OR  Informed Consent: I have reviewed the patients History and Physical, chart, labs and discussed the procedure including the risks, benefits and alternatives for the proposed anesthesia with the patient or authorized representative who has indicated his/her understanding and acceptance.     Dental advisory given  Plan Discussed with: CRNA  Anesthesia Plan Comments:        Anesthesia Quick Evaluation

## 2023-09-02 NOTE — Anesthesia Postprocedure Evaluation (Signed)
 Anesthesia Post Note  Patient: LABERTA WILBON  Procedure(s) Performed: REMOVAL, HARDWARE (Bilateral: Ankle)     Patient location during evaluation: PACU Anesthesia Type: General Level of consciousness: awake and alert Pain management: pain level controlled Vital Signs Assessment: post-procedure vital signs reviewed and stable Respiratory status: spontaneous breathing, nonlabored ventilation, respiratory function stable and patient connected to nasal cannula oxygen Cardiovascular status: blood pressure returned to baseline and stable Postop Assessment: no apparent nausea or vomiting Anesthetic complications: no  No notable events documented.  Last Vitals:  Vitals:   09/02/23 1515 09/02/23 1534  BP: 126/74 (!) 143/87  Pulse: 67 74  Resp: 13 16  Temp:  (!) 36.3 C  SpO2: 98% 99%    Last Pain:  Vitals:   09/02/23 1534  TempSrc: Oral  PainSc: 0-No pain                 Cyriah Childrey L Destiny Hagin

## 2023-09-03 ENCOUNTER — Encounter (HOSPITAL_BASED_OUTPATIENT_CLINIC_OR_DEPARTMENT_OTHER): Payer: Self-pay | Admitting: Orthopaedic Surgery

## 2023-09-25 DIAGNOSIS — S82842D Displaced bimalleolar fracture of left lower leg, subsequent encounter for closed fracture with routine healing: Secondary | ICD-10-CM | POA: Diagnosis not present

## 2023-09-25 DIAGNOSIS — S82841D Displaced bimalleolar fracture of right lower leg, subsequent encounter for closed fracture with routine healing: Secondary | ICD-10-CM | POA: Diagnosis not present

## 2023-10-28 DIAGNOSIS — M25562 Pain in left knee: Secondary | ICD-10-CM | POA: Diagnosis not present

## 2023-10-28 DIAGNOSIS — M170 Bilateral primary osteoarthritis of knee: Secondary | ICD-10-CM | POA: Diagnosis not present

## 2023-11-12 DIAGNOSIS — M1712 Unilateral primary osteoarthritis, left knee: Secondary | ICD-10-CM | POA: Diagnosis not present

## 2023-11-18 DIAGNOSIS — M1712 Unilateral primary osteoarthritis, left knee: Secondary | ICD-10-CM | POA: Diagnosis not present

## 2023-11-25 DIAGNOSIS — M25562 Pain in left knee: Secondary | ICD-10-CM | POA: Diagnosis not present

## 2023-12-28 DIAGNOSIS — S82841D Displaced bimalleolar fracture of right lower leg, subsequent encounter for closed fracture with routine healing: Secondary | ICD-10-CM | POA: Diagnosis not present

## 2023-12-28 DIAGNOSIS — S82842D Displaced bimalleolar fracture of left lower leg, subsequent encounter for closed fracture with routine healing: Secondary | ICD-10-CM | POA: Diagnosis not present

## 2023-12-29 DIAGNOSIS — K080 Exfoliation of teeth due to systemic causes: Secondary | ICD-10-CM | POA: Diagnosis not present

## 2023-12-31 DIAGNOSIS — M25571 Pain in right ankle and joints of right foot: Secondary | ICD-10-CM | POA: Diagnosis not present

## 2023-12-31 DIAGNOSIS — M25572 Pain in left ankle and joints of left foot: Secondary | ICD-10-CM | POA: Diagnosis not present

## 2024-01-05 DIAGNOSIS — M25571 Pain in right ankle and joints of right foot: Secondary | ICD-10-CM | POA: Diagnosis not present

## 2024-01-05 DIAGNOSIS — M25572 Pain in left ankle and joints of left foot: Secondary | ICD-10-CM | POA: Diagnosis not present

## 2024-01-06 DIAGNOSIS — M25572 Pain in left ankle and joints of left foot: Secondary | ICD-10-CM | POA: Diagnosis not present

## 2024-01-06 DIAGNOSIS — M25571 Pain in right ankle and joints of right foot: Secondary | ICD-10-CM | POA: Diagnosis not present

## 2024-01-07 DIAGNOSIS — M25571 Pain in right ankle and joints of right foot: Secondary | ICD-10-CM | POA: Diagnosis not present

## 2024-01-07 DIAGNOSIS — M25572 Pain in left ankle and joints of left foot: Secondary | ICD-10-CM | POA: Diagnosis not present

## 2024-01-11 DIAGNOSIS — M25572 Pain in left ankle and joints of left foot: Secondary | ICD-10-CM | POA: Diagnosis not present

## 2024-01-11 DIAGNOSIS — M25571 Pain in right ankle and joints of right foot: Secondary | ICD-10-CM | POA: Diagnosis not present

## 2024-01-13 DIAGNOSIS — M25572 Pain in left ankle and joints of left foot: Secondary | ICD-10-CM | POA: Diagnosis not present

## 2024-01-13 DIAGNOSIS — M25571 Pain in right ankle and joints of right foot: Secondary | ICD-10-CM | POA: Diagnosis not present

## 2024-01-30 DIAGNOSIS — Z6829 Body mass index (BMI) 29.0-29.9, adult: Secondary | ICD-10-CM | POA: Diagnosis not present

## 2024-01-30 DIAGNOSIS — K121 Other forms of stomatitis: Secondary | ICD-10-CM | POA: Diagnosis not present

## 2024-02-08 DIAGNOSIS — M1712 Unilateral primary osteoarthritis, left knee: Secondary | ICD-10-CM | POA: Diagnosis not present

## 2024-02-28 DIAGNOSIS — M25562 Pain in left knee: Secondary | ICD-10-CM | POA: Diagnosis not present

## 2024-03-08 DIAGNOSIS — S83242A Other tear of medial meniscus, current injury, left knee, initial encounter: Secondary | ICD-10-CM | POA: Diagnosis not present

## 2024-03-31 DIAGNOSIS — M25562 Pain in left knee: Secondary | ICD-10-CM | POA: Diagnosis not present

## 2024-03-31 DIAGNOSIS — S83272S Complex tear of lateral meniscus, current injury, left knee, sequela: Secondary | ICD-10-CM | POA: Diagnosis not present

## 2024-04-25 DIAGNOSIS — X58XXXA Exposure to other specified factors, initial encounter: Secondary | ICD-10-CM | POA: Diagnosis not present

## 2024-04-25 DIAGNOSIS — M794 Hypertrophy of (infrapatellar) fat pad: Secondary | ICD-10-CM | POA: Diagnosis not present

## 2024-04-25 DIAGNOSIS — M94262 Chondromalacia, left knee: Secondary | ICD-10-CM | POA: Diagnosis not present

## 2024-04-25 DIAGNOSIS — S83272A Complex tear of lateral meniscus, current injury, left knee, initial encounter: Secondary | ICD-10-CM | POA: Diagnosis not present

## 2024-04-25 DIAGNOSIS — M65962 Unspecified synovitis and tenosynovitis, left lower leg: Secondary | ICD-10-CM | POA: Diagnosis not present

## 2024-04-25 DIAGNOSIS — G8918 Other acute postprocedural pain: Secondary | ICD-10-CM | POA: Diagnosis not present

## 2024-04-25 DIAGNOSIS — Y999 Unspecified external cause status: Secondary | ICD-10-CM | POA: Diagnosis not present

## 2024-05-02 DIAGNOSIS — M25662 Stiffness of left knee, not elsewhere classified: Secondary | ICD-10-CM | POA: Diagnosis not present

## 2024-05-02 DIAGNOSIS — M25562 Pain in left knee: Secondary | ICD-10-CM | POA: Diagnosis not present

## 2024-05-05 DIAGNOSIS — M25662 Stiffness of left knee, not elsewhere classified: Secondary | ICD-10-CM | POA: Diagnosis not present

## 2024-05-05 DIAGNOSIS — M25562 Pain in left knee: Secondary | ICD-10-CM | POA: Diagnosis not present

## 2024-05-10 DIAGNOSIS — M25562 Pain in left knee: Secondary | ICD-10-CM | POA: Diagnosis not present

## 2024-05-10 DIAGNOSIS — M25662 Stiffness of left knee, not elsewhere classified: Secondary | ICD-10-CM | POA: Diagnosis not present

## 2024-05-12 DIAGNOSIS — M25562 Pain in left knee: Secondary | ICD-10-CM | POA: Diagnosis not present

## 2024-05-12 DIAGNOSIS — M25662 Stiffness of left knee, not elsewhere classified: Secondary | ICD-10-CM | POA: Diagnosis not present

## 2024-05-20 DIAGNOSIS — M25562 Pain in left knee: Secondary | ICD-10-CM | POA: Diagnosis not present

## 2024-05-20 DIAGNOSIS — M25662 Stiffness of left knee, not elsewhere classified: Secondary | ICD-10-CM | POA: Diagnosis not present

## 2024-05-23 DIAGNOSIS — M25662 Stiffness of left knee, not elsewhere classified: Secondary | ICD-10-CM | POA: Diagnosis not present

## 2024-05-23 DIAGNOSIS — M25562 Pain in left knee: Secondary | ICD-10-CM | POA: Diagnosis not present

## 2024-05-31 DIAGNOSIS — L570 Actinic keratosis: Secondary | ICD-10-CM | POA: Diagnosis not present

## 2024-05-31 DIAGNOSIS — D225 Melanocytic nevi of trunk: Secondary | ICD-10-CM | POA: Diagnosis not present

## 2024-05-31 DIAGNOSIS — B078 Other viral warts: Secondary | ICD-10-CM | POA: Diagnosis not present

## 2024-05-31 DIAGNOSIS — Z1283 Encounter for screening for malignant neoplasm of skin: Secondary | ICD-10-CM | POA: Diagnosis not present

## 2024-05-31 DIAGNOSIS — X32XXXD Exposure to sunlight, subsequent encounter: Secondary | ICD-10-CM | POA: Diagnosis not present

## 2024-06-02 DIAGNOSIS — M25662 Stiffness of left knee, not elsewhere classified: Secondary | ICD-10-CM | POA: Diagnosis not present

## 2024-06-02 DIAGNOSIS — M25562 Pain in left knee: Secondary | ICD-10-CM | POA: Diagnosis not present

## 2024-06-14 DIAGNOSIS — M2550 Pain in unspecified joint: Secondary | ICD-10-CM | POA: Diagnosis not present

## 2024-06-14 DIAGNOSIS — Z0000 Encounter for general adult medical examination without abnormal findings: Secondary | ICD-10-CM | POA: Diagnosis not present

## 2024-06-14 DIAGNOSIS — Z23 Encounter for immunization: Secondary | ICD-10-CM | POA: Diagnosis not present

## 2024-06-14 DIAGNOSIS — Z6829 Body mass index (BMI) 29.0-29.9, adult: Secondary | ICD-10-CM | POA: Diagnosis not present

## 2024-07-07 ENCOUNTER — Other Ambulatory Visit (HOSPITAL_COMMUNITY): Payer: Self-pay | Admitting: Family Medicine

## 2024-07-07 DIAGNOSIS — Z1231 Encounter for screening mammogram for malignant neoplasm of breast: Secondary | ICD-10-CM

## 2024-08-10 ENCOUNTER — Ambulatory Visit (HOSPITAL_COMMUNITY)
